# Patient Record
Sex: Male | Born: 1940 | Race: Black or African American | Hispanic: No | Marital: Married | Smoking: Former smoker
Health system: Southern US, Community
[De-identification: ages and names within clinical notes are randomized; demographics above are authoritative.]

## PROBLEM LIST (undated history)

## (undated) DIAGNOSIS — E079 Disorder of thyroid, unspecified: Secondary | ICD-10-CM

## (undated) DIAGNOSIS — K759 Inflammatory liver disease, unspecified: Secondary | ICD-10-CM

## (undated) DIAGNOSIS — Z923 Personal history of irradiation: Secondary | ICD-10-CM

## (undated) DIAGNOSIS — IMO0001 Reserved for inherently not codable concepts without codable children: Secondary | ICD-10-CM

## (undated) DIAGNOSIS — C099 Malignant neoplasm of tonsil, unspecified: Secondary | ICD-10-CM

## (undated) DIAGNOSIS — I10 Essential (primary) hypertension: Secondary | ICD-10-CM

## (undated) DIAGNOSIS — Z9221 Personal history of antineoplastic chemotherapy: Secondary | ICD-10-CM

## (undated) DIAGNOSIS — G8929 Other chronic pain: Secondary | ICD-10-CM

## (undated) DIAGNOSIS — M4800 Spinal stenosis, site unspecified: Secondary | ICD-10-CM

## (undated) DIAGNOSIS — H6690 Otitis media, unspecified, unspecified ear: Secondary | ICD-10-CM

## (undated) DIAGNOSIS — E039 Hypothyroidism, unspecified: Secondary | ICD-10-CM

## (undated) DIAGNOSIS — N400 Enlarged prostate without lower urinary tract symptoms: Secondary | ICD-10-CM

## (undated) DIAGNOSIS — I509 Heart failure, unspecified: Secondary | ICD-10-CM

## (undated) DIAGNOSIS — M549 Dorsalgia, unspecified: Principal | ICD-10-CM

## (undated) DIAGNOSIS — C159 Malignant neoplasm of esophagus, unspecified: Secondary | ICD-10-CM

## (undated) DIAGNOSIS — H538 Other visual disturbances: Secondary | ICD-10-CM

## (undated) DIAGNOSIS — Z5189 Encounter for other specified aftercare: Secondary | ICD-10-CM

## (undated) HISTORY — DX: Benign prostatic hyperplasia without lower urinary tract symptoms: N40.0

## (undated) HISTORY — DX: Other chronic pain: G89.29

## (undated) HISTORY — DX: Dorsalgia, unspecified: M54.9

## (undated) HISTORY — DX: Heart failure, unspecified: I50.9

## (undated) HISTORY — DX: Hypothyroidism, unspecified: E03.9

## (undated) HISTORY — DX: Essential (primary) hypertension: I10

## (undated) HISTORY — DX: Reserved for inherently not codable concepts without codable children: IMO0001

## (undated) HISTORY — PX: TONSILLECTOMY: SUR1361

## (undated) HISTORY — PX: REFRACTIVE SURGERY: SHX103

## (undated) HISTORY — DX: Spinal stenosis, site unspecified: M48.00

## (undated) HISTORY — PX: BACK SURGERY: SHX140

## (undated) HISTORY — DX: Other visual disturbances: H53.8

## (undated) HISTORY — PX: GASTROSTOMY TUBE PLACEMENT: SHX655

## (undated) HISTORY — DX: Otitis media, unspecified, unspecified ear: H66.90

## (undated) HISTORY — DX: Disorder of thyroid, unspecified: E07.9

## (undated) HISTORY — DX: Personal history of antineoplastic chemotherapy: Z92.21

## (undated) HISTORY — PX: CERVICAL FUSION: SHX112

## (undated) HISTORY — DX: Inflammatory liver disease, unspecified: K75.9

## (undated) HISTORY — DX: Malignant neoplasm of tonsil, unspecified: C09.9

## (undated) HISTORY — DX: Encounter for other specified aftercare: Z51.89

## (undated) HISTORY — DX: Personal history of irradiation: Z92.3

---

## 1993-08-22 DIAGNOSIS — K759 Inflammatory liver disease, unspecified: Secondary | ICD-10-CM

## 1993-08-22 HISTORY — DX: Inflammatory liver disease, unspecified: K75.9

## 1997-11-20 ENCOUNTER — Encounter: Admission: RE | Admit: 1997-11-20 | Discharge: 1998-02-18 | Payer: Self-pay | Admitting: Neurosurgery

## 2002-03-06 ENCOUNTER — Encounter: Payer: Self-pay | Admitting: Emergency Medicine

## 2002-03-06 ENCOUNTER — Emergency Department (HOSPITAL_COMMUNITY): Admission: EM | Admit: 2002-03-06 | Discharge: 2002-03-06 | Payer: Self-pay | Admitting: Emergency Medicine

## 2003-01-22 ENCOUNTER — Ambulatory Visit (HOSPITAL_COMMUNITY): Admission: RE | Admit: 2003-01-22 | Discharge: 2003-01-22 | Payer: Self-pay | Admitting: Neurosurgery

## 2003-01-22 ENCOUNTER — Encounter: Payer: Self-pay | Admitting: Neurosurgery

## 2003-02-05 ENCOUNTER — Encounter: Payer: Self-pay | Admitting: Neurosurgery

## 2003-02-05 ENCOUNTER — Inpatient Hospital Stay (HOSPITAL_COMMUNITY): Admission: RE | Admit: 2003-02-05 | Discharge: 2003-02-12 | Payer: Self-pay | Admitting: Neurosurgery

## 2004-04-24 ENCOUNTER — Emergency Department (HOSPITAL_COMMUNITY): Admission: EM | Admit: 2004-04-24 | Discharge: 2004-04-24 | Payer: Self-pay | Admitting: Emergency Medicine

## 2004-12-31 ENCOUNTER — Ambulatory Visit (HOSPITAL_COMMUNITY): Admission: RE | Admit: 2004-12-31 | Discharge: 2004-12-31 | Payer: Self-pay | Admitting: Neurosurgery

## 2005-02-02 ENCOUNTER — Inpatient Hospital Stay (HOSPITAL_COMMUNITY): Admission: RE | Admit: 2005-02-02 | Discharge: 2005-02-05 | Payer: Self-pay | Admitting: Neurosurgery

## 2009-11-12 ENCOUNTER — Ambulatory Visit: Payer: Self-pay | Admitting: Oncology

## 2009-11-16 ENCOUNTER — Ambulatory Visit: Admission: RE | Admit: 2009-11-16 | Discharge: 2010-02-10 | Payer: Self-pay | Admitting: Radiation Oncology

## 2009-11-18 LAB — COMPREHENSIVE METABOLIC PANEL
AST: 13 U/L (ref 0–37)
Alkaline Phosphatase: 66 U/L (ref 39–117)
BUN: 16 mg/dL (ref 6–23)
Creatinine, Ser: 1.43 mg/dL (ref 0.40–1.50)
Potassium: 4.4 mEq/L (ref 3.5–5.3)

## 2009-11-18 LAB — CBC WITH DIFFERENTIAL/PLATELET
Basophils Absolute: 0 10*3/uL (ref 0.0–0.1)
EOS%: 1.2 % (ref 0.0–7.0)
Eosinophils Absolute: 0.1 10*3/uL (ref 0.0–0.5)
HGB: 15.5 g/dL (ref 13.0–17.1)
MCV: 85.1 fL (ref 79.3–98.0)
MONO%: 8.9 % (ref 0.0–14.0)
NEUT#: 3.9 10*3/uL (ref 1.5–6.5)
RBC: 5.29 10*6/uL (ref 4.20–5.82)
RDW: 13.1 % (ref 11.0–14.6)
lymph#: 1.2 10*3/uL (ref 0.9–3.3)

## 2009-11-18 LAB — PROTIME-INR
INR: 1.1 — ABNORMAL LOW (ref 2.00–3.50)
Protime: 13.2 Seconds (ref 10.6–13.4)

## 2009-11-26 ENCOUNTER — Ambulatory Visit: Payer: Self-pay | Admitting: Dentistry

## 2009-11-26 ENCOUNTER — Encounter: Admission: AD | Admit: 2009-11-26 | Discharge: 2009-11-26 | Payer: Self-pay | Admitting: Dentistry

## 2009-11-30 ENCOUNTER — Encounter: Admission: RE | Admit: 2009-11-30 | Discharge: 2010-02-28 | Payer: Self-pay | Admitting: Radiation Oncology

## 2009-12-01 ENCOUNTER — Ambulatory Visit (HOSPITAL_COMMUNITY): Admission: RE | Admit: 2009-12-01 | Discharge: 2009-12-01 | Payer: Self-pay | Admitting: Oncology

## 2009-12-08 ENCOUNTER — Emergency Department (HOSPITAL_COMMUNITY): Admission: EM | Admit: 2009-12-08 | Discharge: 2009-12-08 | Payer: Self-pay | Admitting: Emergency Medicine

## 2009-12-09 LAB — CBC WITH DIFFERENTIAL/PLATELET
BASO%: 0.6 % (ref 0.0–2.0)
Eosinophils Absolute: 0.1 10*3/uL (ref 0.0–0.5)
LYMPH%: 21.6 % (ref 14.0–49.0)
MCHC: 34.1 g/dL (ref 32.0–36.0)
MONO#: 0.5 10*3/uL (ref 0.1–0.9)
NEUT#: 3.2 10*3/uL (ref 1.5–6.5)
RBC: 5.37 10*6/uL (ref 4.20–5.82)
RDW: 13.3 % (ref 11.0–14.6)
WBC: 4.9 10*3/uL (ref 4.0–10.3)
lymph#: 1.1 10*3/uL (ref 0.9–3.3)

## 2009-12-10 LAB — BUN: BUN: 13 mg/dL (ref 6–23)

## 2009-12-10 LAB — CREATININE, SERUM: Creatinine, Ser: 1.39 mg/dL (ref 0.40–1.50)

## 2009-12-25 ENCOUNTER — Ambulatory Visit: Payer: Self-pay | Admitting: Oncology

## 2009-12-28 LAB — CBC WITH DIFFERENTIAL/PLATELET
EOS%: 1.2 % (ref 0.0–7.0)
Eosinophils Absolute: 0.1 10*3/uL (ref 0.0–0.5)
HCT: 46.1 % (ref 38.4–49.9)
HGB: 15.8 g/dL (ref 13.0–17.1)
LYMPH%: 23.7 % (ref 14.0–49.0)
MCH: 28.7 pg (ref 27.2–33.4)
MONO%: 10.8 % (ref 0.0–14.0)
NEUT#: 3.1 10*3/uL (ref 1.5–6.5)
WBC: 4.9 10*3/uL (ref 4.0–10.3)
lymph#: 1.2 10*3/uL (ref 0.9–3.3)

## 2009-12-28 LAB — COMPREHENSIVE METABOLIC PANEL
AST: 17 U/L (ref 0–37)
Alkaline Phosphatase: 64 U/L (ref 39–117)
BUN: 13 mg/dL (ref 6–23)
CO2: 26 mEq/L (ref 19–32)
Calcium: 9 mg/dL (ref 8.4–10.5)
Sodium: 140 mEq/L (ref 135–145)
Total Bilirubin: 1.3 mg/dL — ABNORMAL HIGH (ref 0.3–1.2)

## 2009-12-28 LAB — MAGNESIUM: Magnesium: 2.3 mg/dL (ref 1.5–2.5)

## 2010-01-04 LAB — CBC WITH DIFFERENTIAL/PLATELET
BASO%: 0.6 % (ref 0.0–2.0)
Basophils Absolute: 0 10*3/uL (ref 0.0–0.1)
EOS%: 0.7 % (ref 0.0–7.0)
Eosinophils Absolute: 0 10*3/uL (ref 0.0–0.5)
HCT: 41.9 % (ref 38.4–49.9)
HGB: 14.9 g/dL (ref 13.0–17.1)
MCHC: 35.6 g/dL (ref 32.0–36.0)
MCV: 82.6 fL (ref 79.3–98.0)
RDW: 12.8 % (ref 11.0–14.6)
lymph#: 0.6 10*3/uL — ABNORMAL LOW (ref 0.9–3.3)

## 2010-01-04 LAB — COMPREHENSIVE METABOLIC PANEL
ALT: 19 U/L (ref 0–53)
Albumin: 4 g/dL (ref 3.5–5.2)
Alkaline Phosphatase: 66 U/L (ref 39–117)
BUN: 29 mg/dL — ABNORMAL HIGH (ref 6–23)
Calcium: 8.4 mg/dL (ref 8.4–10.5)
Chloride: 99 mEq/L (ref 96–112)
Glucose, Bld: 105 mg/dL — ABNORMAL HIGH (ref 70–99)
Potassium: 3.9 mEq/L (ref 3.5–5.3)
Total Bilirubin: 1.5 mg/dL — ABNORMAL HIGH (ref 0.3–1.2)

## 2010-01-04 LAB — MAGNESIUM: Magnesium: 1.9 mg/dL (ref 1.5–2.5)

## 2010-01-11 LAB — CBC WITH DIFFERENTIAL/PLATELET
BASO%: 0.2 % (ref 0.0–2.0)
EOS%: 1.2 % (ref 0.0–7.0)
HGB: 15.1 g/dL (ref 13.0–17.1)
LYMPH%: 10.6 % — ABNORMAL LOW (ref 14.0–49.0)
MCH: 29 pg (ref 27.2–33.4)
MCHC: 35.6 g/dL (ref 32.0–36.0)
MONO#: 0.4 10*3/uL (ref 0.1–0.9)
MONO%: 8.2 % (ref 0.0–14.0)
NEUT#: 3.4 10*3/uL (ref 1.5–6.5)
Platelets: 114 10*3/uL — ABNORMAL LOW (ref 140–400)
RBC: 5.2 10*6/uL (ref 4.20–5.82)

## 2010-01-11 LAB — COMPREHENSIVE METABOLIC PANEL
ALT: 15 U/L (ref 0–53)
BUN: 20 mg/dL (ref 6–23)
CO2: 30 mEq/L (ref 19–32)
Chloride: 101 mEq/L (ref 96–112)
Creatinine, Ser: 2.29 mg/dL — ABNORMAL HIGH (ref 0.40–1.50)
Potassium: 4.3 mEq/L (ref 3.5–5.3)
Total Bilirubin: 1.7 mg/dL — ABNORMAL HIGH (ref 0.3–1.2)

## 2010-01-15 LAB — COMPREHENSIVE METABOLIC PANEL
Albumin: 4.3 g/dL (ref 3.5–5.2)
Alkaline Phosphatase: 61 U/L (ref 39–117)
BUN: 27 mg/dL — ABNORMAL HIGH (ref 6–23)
CO2: 27 mEq/L (ref 19–32)
Glucose, Bld: 93 mg/dL (ref 70–99)
Total Bilirubin: 1.1 mg/dL (ref 0.3–1.2)

## 2010-01-15 LAB — CBC WITH DIFFERENTIAL/PLATELET
Basophils Absolute: 0 10*3/uL (ref 0.0–0.1)
EOS%: 2.6 % (ref 0.0–7.0)
HGB: 14.1 g/dL (ref 13.0–17.1)
MCHC: 35.3 g/dL (ref 32.0–36.0)
MCV: 83.6 fL (ref 79.3–98.0)
MONO#: 0.3 10*3/uL (ref 0.1–0.9)
MONO%: 24.5 % — ABNORMAL HIGH (ref 0.0–14.0)
NEUT%: 52.2 % (ref 39.0–75.0)
RBC: 4.77 10*6/uL (ref 4.20–5.82)
WBC: 1.3 10*3/uL — ABNORMAL LOW (ref 4.0–10.3)
lymph#: 0.2 10*3/uL — ABNORMAL LOW (ref 0.9–3.3)

## 2010-01-19 LAB — COMPREHENSIVE METABOLIC PANEL
AST: 17 U/L (ref 0–37)
Albumin: 4.1 g/dL (ref 3.5–5.2)
BUN: 35 mg/dL — ABNORMAL HIGH (ref 6–23)
Calcium: 9.4 mg/dL (ref 8.4–10.5)
Chloride: 96 mEq/L (ref 96–112)
Glucose, Bld: 102 mg/dL — ABNORMAL HIGH (ref 70–99)
Potassium: 3.8 mEq/L (ref 3.5–5.3)
Sodium: 138 mEq/L (ref 135–145)
Total Protein: 8 g/dL (ref 6.0–8.3)

## 2010-01-19 LAB — CBC WITH DIFFERENTIAL/PLATELET
BASO%: 1.8 % (ref 0.0–2.0)
EOS%: 1.8 % (ref 0.0–7.0)
Eosinophils Absolute: 0 10*3/uL (ref 0.0–0.5)
LYMPH%: 25.3 % (ref 14.0–49.0)
MCHC: 34.7 g/dL (ref 32.0–36.0)
MCV: 82.9 fL (ref 79.3–98.0)
MONO%: 36.7 % — ABNORMAL HIGH (ref 0.0–14.0)
NEUT#: 0.6 10*3/uL — ABNORMAL LOW (ref 1.5–6.5)
Platelets: 226 10*3/uL (ref 140–400)
RBC: 5.08 10*6/uL (ref 4.20–5.82)
RDW: 13.2 % (ref 11.0–14.6)

## 2010-01-20 ENCOUNTER — Ambulatory Visit (HOSPITAL_COMMUNITY): Admission: RE | Admit: 2010-01-20 | Discharge: 2010-01-20 | Payer: Self-pay | Admitting: Radiation Oncology

## 2010-01-25 ENCOUNTER — Ambulatory Visit: Payer: Self-pay | Admitting: Oncology

## 2010-01-25 ENCOUNTER — Other Ambulatory Visit: Payer: Self-pay | Admitting: Oncology

## 2010-01-25 LAB — COMPREHENSIVE METABOLIC PANEL
ALT: 18 U/L (ref 0–53)
AST: 17 U/L (ref 0–37)
Albumin: 3.9 g/dL (ref 3.5–5.2)
Alkaline Phosphatase: 60 U/L (ref 39–117)
BUN: 28 mg/dL — ABNORMAL HIGH (ref 6–23)
Calcium: 9.5 mg/dL (ref 8.4–10.5)
Chloride: 96 mEq/L (ref 96–112)
Sodium: 136 mEq/L (ref 135–145)
Total Protein: 7.4 g/dL (ref 6.0–8.3)

## 2010-01-25 LAB — CBC WITH DIFFERENTIAL/PLATELET
HCT: 40.2 % (ref 38.4–49.9)
HGB: 14.2 g/dL (ref 13.0–17.1)
MCH: 28.7 pg (ref 27.2–33.4)
MCV: 81.2 fL (ref 79.3–98.0)
MONO%: 17.7 % — ABNORMAL HIGH (ref 0.0–14.0)
NEUT#: 2.2 10*3/uL (ref 1.5–6.5)
RBC: 4.95 10*6/uL (ref 4.20–5.82)
WBC: 3.1 10*3/uL — ABNORMAL LOW (ref 4.0–10.3)
lymph#: 0.3 10*3/uL — ABNORMAL LOW (ref 0.9–3.3)

## 2010-01-26 ENCOUNTER — Ambulatory Visit: Payer: Self-pay | Admitting: Dentistry

## 2010-02-01 LAB — COMPREHENSIVE METABOLIC PANEL
Calcium: 9.3 mg/dL (ref 8.4–10.5)
Chloride: 98 mEq/L (ref 96–112)
Creatinine, Ser: 2.36 mg/dL — ABNORMAL HIGH (ref 0.40–1.50)
Potassium: 4.8 mEq/L (ref 3.5–5.3)
Sodium: 134 mEq/L — ABNORMAL LOW (ref 135–145)
Total Bilirubin: 1.7 mg/dL — ABNORMAL HIGH (ref 0.3–1.2)
Total Protein: 7.3 g/dL (ref 6.0–8.3)

## 2010-02-01 LAB — CBC WITH DIFFERENTIAL/PLATELET
Basophils Absolute: 0 10*3/uL (ref 0.0–0.1)
EOS%: 0.4 % (ref 0.0–7.0)
HCT: 39.1 % (ref 38.4–49.9)
HGB: 13.8 g/dL (ref 13.0–17.1)
MCH: 28.8 pg (ref 27.2–33.4)
MCV: 81.6 fL (ref 79.3–98.0)
MONO%: 8.5 % (ref 0.0–14.0)
NEUT%: 76.4 % — ABNORMAL HIGH (ref 39.0–75.0)
Platelets: 129 10*3/uL — ABNORMAL LOW (ref 140–400)
RBC: 4.79 10*6/uL (ref 4.20–5.82)
RDW: 13.4 % (ref 11.0–14.6)

## 2010-02-01 LAB — MAGNESIUM: Magnesium: 2.2 mg/dL (ref 1.5–2.5)

## 2010-02-08 LAB — COMPREHENSIVE METABOLIC PANEL
ALT: 9 U/L (ref 0–53)
AST: 12 U/L (ref 0–37)
Albumin: 3.8 g/dL (ref 3.5–5.2)
Alkaline Phosphatase: 47 U/L (ref 39–117)
BUN: 18 mg/dL (ref 6–23)
Calcium: 8.6 mg/dL (ref 8.4–10.5)
Creatinine, Ser: 1.3 mg/dL (ref 0.40–1.50)
Glucose, Bld: 103 mg/dL — ABNORMAL HIGH (ref 70–99)
Total Bilirubin: 1 mg/dL (ref 0.3–1.2)
Total Protein: 6.5 g/dL (ref 6.0–8.3)

## 2010-02-08 LAB — CBC WITH DIFFERENTIAL/PLATELET
EOS%: 1.2 % (ref 0.0–7.0)
Eosinophils Absolute: 0 10*3/uL (ref 0.0–0.5)
MCHC: 33.7 g/dL (ref 32.0–36.0)
MCV: 84.5 fL (ref 79.3–98.0)
MONO%: 8.2 % (ref 0.0–14.0)
NEUT#: 2 10*3/uL (ref 1.5–6.5)
RBC: 4.3 10*6/uL (ref 4.20–5.82)
RDW: 13.4 % (ref 11.0–14.6)
lymph#: 0.1 10*3/uL — ABNORMAL LOW (ref 0.9–3.3)

## 2010-02-15 ENCOUNTER — Ambulatory Visit: Admission: RE | Admit: 2010-02-15 | Discharge: 2010-02-15 | Payer: Self-pay | Admitting: Radiation Oncology

## 2010-02-25 ENCOUNTER — Ambulatory Visit: Payer: Self-pay | Admitting: Oncology

## 2010-02-25 LAB — COMPREHENSIVE METABOLIC PANEL
ALT: 18 U/L (ref 0–53)
AST: 21 U/L (ref 0–37)
Alkaline Phosphatase: 54 U/L (ref 39–117)
Calcium: 9.3 mg/dL (ref 8.4–10.5)
Creatinine, Ser: 1.35 mg/dL (ref 0.40–1.50)
Potassium: 3.9 mEq/L (ref 3.5–5.3)
Total Bilirubin: 0.6 mg/dL (ref 0.3–1.2)
Total Protein: 7 g/dL (ref 6.0–8.3)

## 2010-02-25 LAB — MAGNESIUM: Magnesium: 2.1 mg/dL (ref 1.5–2.5)

## 2010-02-25 LAB — CBC WITH DIFFERENTIAL/PLATELET
Eosinophils Absolute: 0 10*3/uL (ref 0.0–0.5)
LYMPH%: 7.6 % — ABNORMAL LOW (ref 14.0–49.0)
MONO#: 0.5 10*3/uL (ref 0.1–0.9)
NEUT#: 2.6 10*3/uL (ref 1.5–6.5)
NEUT%: 77.8 % — ABNORMAL HIGH (ref 39.0–75.0)
Platelets: 421 10*3/uL — ABNORMAL HIGH (ref 140–400)
RBC: 3.85 10*6/uL — ABNORMAL LOW (ref 4.20–5.82)
RDW: 14.9 % — ABNORMAL HIGH (ref 11.0–14.6)
WBC: 3.4 10*3/uL — ABNORMAL LOW (ref 4.0–10.3)
lymph#: 0.3 10*3/uL — ABNORMAL LOW (ref 0.9–3.3)

## 2010-03-15 LAB — COMPREHENSIVE METABOLIC PANEL
ALT: 16 U/L (ref 0–53)
Albumin: 3.8 g/dL (ref 3.5–5.2)
CO2: 31 mEq/L (ref 19–32)
Calcium: 8.9 mg/dL (ref 8.4–10.5)
Sodium: 141 mEq/L (ref 135–145)
Total Bilirubin: 0.6 mg/dL (ref 0.3–1.2)

## 2010-03-15 LAB — CBC WITH DIFFERENTIAL/PLATELET
BASO%: 0.7 % (ref 0.0–2.0)
Basophils Absolute: 0 10*3/uL (ref 0.0–0.1)
Eosinophils Absolute: 0.1 10*3/uL (ref 0.0–0.5)
HGB: 11.7 g/dL — ABNORMAL LOW (ref 13.0–17.1)
LYMPH%: 14 % (ref 14.0–49.0)
MCHC: 34.8 g/dL (ref 32.0–36.0)
MONO#: 0.4 10*3/uL (ref 0.1–0.9)
NEUT%: 71.1 % (ref 39.0–75.0)
RBC: 3.9 10*6/uL — ABNORMAL LOW (ref 4.20–5.82)
lymph#: 0.5 10*3/uL — ABNORMAL LOW (ref 0.9–3.3)

## 2010-04-08 ENCOUNTER — Ambulatory Visit: Payer: Self-pay | Admitting: Oncology

## 2010-04-27 ENCOUNTER — Ambulatory Visit: Admission: RE | Admit: 2010-04-27 | Discharge: 2010-04-27 | Payer: Self-pay | Admitting: Radiation Oncology

## 2010-05-10 ENCOUNTER — Ambulatory Visit: Payer: Self-pay | Admitting: Oncology

## 2010-05-11 ENCOUNTER — Ambulatory Visit (HOSPITAL_COMMUNITY): Admission: RE | Admit: 2010-05-11 | Discharge: 2010-05-11 | Payer: Self-pay | Admitting: Oncology

## 2010-05-12 LAB — CBC WITH DIFFERENTIAL/PLATELET
BASO%: 0.7 % (ref 0.0–2.0)
Eosinophils Absolute: 0.1 10*3/uL (ref 0.0–0.5)
MCV: 86.9 fL (ref 79.3–98.0)
Platelets: 223 10*3/uL (ref 140–400)
WBC: 2.9 10*3/uL — ABNORMAL LOW (ref 4.0–10.3)
lymph#: 0.4 10*3/uL — ABNORMAL LOW (ref 0.9–3.3)

## 2010-05-13 LAB — COMPREHENSIVE METABOLIC PANEL
AST: 11 U/L (ref 0–37)
Albumin: 4 g/dL (ref 3.5–5.2)
Alkaline Phosphatase: 56 U/L (ref 39–117)
BUN: 24 mg/dL — ABNORMAL HIGH (ref 6–23)
CO2: 26 mEq/L (ref 19–32)
Calcium: 9.1 mg/dL (ref 8.4–10.5)
Creatinine, Ser: 1.42 mg/dL (ref 0.40–1.50)
Glucose, Bld: 96 mg/dL (ref 70–99)
Potassium: 4.3 mEq/L (ref 3.5–5.3)
Total Bilirubin: 0.5 mg/dL (ref 0.3–1.2)

## 2010-05-13 LAB — MAGNESIUM: Magnesium: 2.1 mg/dL (ref 1.5–2.5)

## 2010-07-29 ENCOUNTER — Inpatient Hospital Stay (HOSPITAL_COMMUNITY): Admission: EM | Admit: 2010-07-29 | Discharge: 2010-02-18 | Payer: Self-pay | Admitting: Emergency Medicine

## 2010-09-12 ENCOUNTER — Encounter: Payer: Self-pay | Admitting: Oncology

## 2010-09-30 ENCOUNTER — Other Ambulatory Visit: Payer: Self-pay | Admitting: Oncology

## 2010-09-30 ENCOUNTER — Encounter (HOSPITAL_BASED_OUTPATIENT_CLINIC_OR_DEPARTMENT_OTHER): Payer: MEDICARE | Admitting: Oncology

## 2010-09-30 DIAGNOSIS — N289 Disorder of kidney and ureter, unspecified: Secondary | ICD-10-CM

## 2010-09-30 DIAGNOSIS — C099 Malignant neoplasm of tonsil, unspecified: Secondary | ICD-10-CM

## 2010-09-30 LAB — COMPREHENSIVE METABOLIC PANEL
AST: 15 U/L (ref 0–37)
Albumin: 3.9 g/dL (ref 3.5–5.2)
Alkaline Phosphatase: 52 U/L (ref 39–117)
CO2: 30 mEq/L (ref 19–32)
Calcium: 8.7 mg/dL (ref 8.4–10.5)
Chloride: 101 mEq/L (ref 96–112)
Creatinine, Ser: 1.4 mg/dL (ref 0.40–1.50)
Glucose, Bld: 95 mg/dL (ref 70–99)
Total Bilirubin: 0.6 mg/dL (ref 0.3–1.2)

## 2010-09-30 LAB — CBC WITH DIFFERENTIAL/PLATELET
Basophils Absolute: 0 10*3/uL (ref 0.0–0.1)
HGB: 12.6 g/dL — ABNORMAL LOW (ref 13.0–17.1)
MCV: 85.9 fL (ref 79.3–98.0)
MONO#: 0.5 10*3/uL (ref 0.1–0.9)
MONO%: 14.9 % — ABNORMAL HIGH (ref 0.0–14.0)
NEUT%: 70.4 % (ref 39.0–75.0)
RDW: 14 % (ref 11.0–14.6)
lymph#: 0.4 10*3/uL — ABNORMAL LOW (ref 0.9–3.3)

## 2010-11-03 ENCOUNTER — Other Ambulatory Visit: Payer: Self-pay | Admitting: Radiation Oncology

## 2010-11-03 ENCOUNTER — Ambulatory Visit: Payer: MEDICARE | Attending: Radiation Oncology | Admitting: Radiation Oncology

## 2010-11-03 DIAGNOSIS — C099 Malignant neoplasm of tonsil, unspecified: Secondary | ICD-10-CM | POA: Insufficient documentation

## 2010-11-03 DIAGNOSIS — I10 Essential (primary) hypertension: Secondary | ICD-10-CM | POA: Insufficient documentation

## 2010-11-03 DIAGNOSIS — R7309 Other abnormal glucose: Secondary | ICD-10-CM | POA: Insufficient documentation

## 2010-11-03 LAB — CBC WITH DIFFERENTIAL/PLATELET
Basophils Absolute: 0 10*3/uL (ref 0.0–0.1)
Eosinophils Absolute: 0.1 10*3/uL (ref 0.0–0.5)
HCT: 38.5 % (ref 38.4–49.9)
HGB: 13 g/dL (ref 13.0–17.1)
LYMPH%: 6.7 % — ABNORMAL LOW (ref 14.0–49.0)
MCV: 85 fL (ref 79.3–98.0)
MONO%: 9.2 % (ref 0.0–14.0)
NEUT#: 4 10*3/uL (ref 1.5–6.5)
Platelets: 186 10*3/uL (ref 140–400)

## 2010-11-03 LAB — TSH: TSH: 4.855 u[IU]/mL — ABNORMAL HIGH (ref 0.350–4.500)

## 2010-11-03 LAB — BASIC METABOLIC PANEL
BUN: 23 mg/dL (ref 6–23)
Calcium: 9.4 mg/dL (ref 8.4–10.5)
Glucose, Bld: 103 mg/dL — ABNORMAL HIGH (ref 70–99)

## 2010-11-07 LAB — CULTURE, BLOOD (ROUTINE X 2)

## 2010-11-07 LAB — URINALYSIS, ROUTINE W REFLEX MICROSCOPIC
Bilirubin Urine: NEGATIVE
Glucose, UA: NEGATIVE mg/dL
pH: 6 (ref 5.0–8.0)

## 2010-11-07 LAB — URINE MICROSCOPIC-ADD ON

## 2010-11-07 LAB — CBC
HCT: 31.4 % — ABNORMAL LOW (ref 39.0–52.0)
Hemoglobin: 11.2 g/dL — ABNORMAL LOW (ref 13.0–17.0)
MCV: 84.4 fL (ref 78.0–100.0)
RBC: 3.72 MIL/uL — ABNORMAL LOW (ref 4.22–5.81)
WBC: 3.4 10*3/uL — ABNORMAL LOW (ref 4.0–10.5)

## 2010-11-07 LAB — COMPREHENSIVE METABOLIC PANEL
Albumin: 3.5 g/dL (ref 3.5–5.2)
Alkaline Phosphatase: 51 U/L (ref 39–117)
BUN: 21 mg/dL (ref 6–23)
Chloride: 99 mEq/L (ref 96–112)
Glucose, Bld: 159 mg/dL — ABNORMAL HIGH (ref 70–99)
Potassium: 4.1 mEq/L (ref 3.5–5.1)
Total Bilirubin: 0.8 mg/dL (ref 0.3–1.2)

## 2010-11-07 LAB — OVA AND PARASITE EXAMINATION: Ova and parasites: NONE SEEN

## 2010-11-07 LAB — D-DIMER, QUANTITATIVE: D-Dimer, Quant: 1.27 ug/mL-FEU — ABNORMAL HIGH (ref 0.00–0.48)

## 2010-11-07 LAB — CLOSTRIDIUM DIFFICILE EIA: C difficile Toxins A+B, EIA: NEGATIVE

## 2010-11-07 LAB — DIFFERENTIAL
Basophils Absolute: 0 10*3/uL (ref 0.0–0.1)
Basophils Relative: 0 % (ref 0–1)
Monocytes Absolute: 0.4 10*3/uL (ref 0.1–1.0)
Neutro Abs: 2.9 10*3/uL (ref 1.7–7.7)
Neutrophils Relative %: 84 % — ABNORMAL HIGH (ref 43–77)

## 2010-11-07 LAB — GLUCOSE, CAPILLARY
Glucose-Capillary: 137 mg/dL — ABNORMAL HIGH (ref 70–99)
Glucose-Capillary: 147 mg/dL — ABNORMAL HIGH (ref 70–99)

## 2010-11-07 LAB — URINE CULTURE

## 2010-11-07 LAB — STOOL CULTURE

## 2010-11-08 LAB — CBC
Platelets: 332 10*3/uL (ref 150–400)
RDW: 13.8 % (ref 11.5–15.5)

## 2010-11-08 LAB — PROTIME-INR: INR: 1.11 (ref 0.00–1.49)

## 2010-11-08 LAB — APTT: aPTT: 34 seconds (ref 24–37)

## 2010-11-08 LAB — BASIC METABOLIC PANEL
BUN: 32 mg/dL — ABNORMAL HIGH (ref 6–23)
Calcium: 9.1 mg/dL (ref 8.4–10.5)
Creatinine, Ser: 2.38 mg/dL — ABNORMAL HIGH (ref 0.4–1.5)
GFR calc non Af Amer: 27 mL/min — ABNORMAL LOW (ref >60)
Glucose, Bld: 107 mg/dL — ABNORMAL HIGH (ref 70–99)

## 2010-11-09 ENCOUNTER — Other Ambulatory Visit (HOSPITAL_COMMUNITY): Payer: MEDICARE

## 2010-11-09 ENCOUNTER — Ambulatory Visit (HOSPITAL_COMMUNITY)
Admission: RE | Admit: 2010-11-09 | Discharge: 2010-11-09 | Disposition: A | Discharge status: Home / Self Care | Payer: MEDICARE | Source: Ambulatory Visit | Attending: Radiation Oncology | Admitting: Radiation Oncology

## 2010-11-09 DIAGNOSIS — Z431 Encounter for attention to gastrostomy: Secondary | ICD-10-CM | POA: Insufficient documentation

## 2010-11-09 DIAGNOSIS — C099 Malignant neoplasm of tonsil, unspecified: Secondary | ICD-10-CM | POA: Insufficient documentation

## 2010-11-09 LAB — CBC
MCV: 85.9 fL (ref 78.0–100.0)
Platelets: 177 10*3/uL (ref 150–400)
RDW: 13.6 % (ref 11.5–15.5)
WBC: 4.9 10*3/uL (ref 4.0–10.5)

## 2010-11-09 LAB — DIFFERENTIAL
Basophils Absolute: 0 10*3/uL (ref 0.0–0.1)
Eosinophils Absolute: 0.1 10*3/uL (ref 0.0–0.7)
Lymphocytes Relative: 23 % (ref 12–46)
Lymphs Abs: 1.1 10*3/uL (ref 0.7–4.0)
Neutrophils Relative %: 65 % (ref 43–77)

## 2010-11-09 LAB — BASIC METABOLIC PANEL
BUN: 19 mg/dL (ref 6–23)
Creatinine, Ser: 1.51 mg/dL — ABNORMAL HIGH (ref 0.4–1.5)
GFR calc non Af Amer: 46 mL/min — ABNORMAL LOW (ref >60)
Glucose, Bld: 117 mg/dL — ABNORMAL HIGH (ref 70–99)

## 2010-11-09 LAB — POCT CARDIAC MARKERS: Myoglobin, poc: 58.4 ng/mL (ref 12–200)

## 2011-01-07 NOTE — H&P (Signed)
NAME:  ALOYSIUS, Adam Chambers NO.:  [id number]   MEDICAL RECORD NO.:  [id number]          PATIENT TYPE:  INP   LOCATION:  2899                         FACILITY:  [facility]   PHYSICIAN:  Hilda Lias, M.D.   DATE OF BIRTH:  27-Jun-1941   DATE OF ADMISSION:  02/02/2005  DATE OF DISCHARGE:                                HISTORY & PHYSICAL   Adam Chambers is a gentleman who in the past underwent back surgery.  Now he has  been complaining of neck pain with radiation going to the left upper  extremity associated with weakness and decrease of cervical movement because  of the pain.  The patient had a neurological workup which showed compromise  with a stenosis at 3-4, 4-5, and 5-6, and _________ to C6 and C7.  Because  of pain, the patient wants to proceed with surgery.   PAST MEDICAL HISTORY:  Lumbar fusion.   FAMILY HISTORY:  Unremarkable.   REVIEW OF SYSTEMS:  Positive for neck and left upper extremity pain.   PHYSICAL EXAMINATION:  HEENT:  Normal.  NECK:  He is able to flex and extend __________.  CARDIOVASCULAR:  Normal.  LUNGS:  Clear.  ABDOMEN:  Normal.  EXTREMITIES:  Normal pulses.  NEUROLOGY:  Mental status normal.  Cranial nerves normal.  Strength showed  that he has weakness in the deltoid and biceps.  Reflexes are symmetrical at  1+ and no Babinski's.  Sensation is normal.   The cervical spine x-ray showed that he has cervical stenosis with cervical  spondylosis at the level of 3-4, 4-5, and 5-6, and positive __________ at C6  and C7.   PLAN:  Decompression at the level of 3-4, 4-5, 5-6, and possible at C6-7.   RECOMMENDATIONS:  The patient is being admitted for decompression of those  three or four levels.  Decision about C6-7 will be made during surgery.  The  risks were fully explained to him including the possibility of no  improvement, damage to the vocal cord, damage to the esophagus, and stroke.       EB/MEDQ  D:  02/02/2005  T:  02/02/2005  Job:   [id number]

## 2011-01-07 NOTE — Op Note (Signed)
NAME:  Adam Chambers, Adam Chambers                   ACCOUNT NO.:  [id number]   MEDICAL RECORD NO.:  [id number]          PATIENT TYPE:  INP   LOCATION:  2899                         FACILITY:  [facility]   PHYSICIAN:  Hilda Lias, M.D.   DATE OF BIRTH:  04/16/1941   DATE OF PROCEDURE:  02/02/2005  DATE OF DISCHARGE:                                 OPERATIVE REPORT   PREOPERATIVE DIAGNOSIS:  C3-C4, C4-C5, C5-C6, and C6-C7 stenosis with  calcifications of the posterior ligament with acute left radiculopathy.   POSTOPERATIVE DIAGNOSIS:  C3-C4, C4-C5, C5-C6, and C6-C7 stenosis with  calcifications of the posterior ligament with acute left radiculopathy.   PROCEDURE:  Anterior decompression of C3-C4, C4-C5, C5-C6, and C6-C7,  interbody fusion with allograft, plate, microscope.   SURGEON:  Hilda Lias, M.D.   ASSISTANT:  Coletta Memos, M.D.   CLINICAL HISTORY:  The patient was admitted because of neck and left upper  extremity pain.  The patient has failed conservative treatment.  X-ray  showed stenosis at the level of C3-C4, C4-C5, C5-C6, and C6-C7 with  calcification of the posterior ligament.  The surgery was explained to him  and his wife including the possibility of posterior decompression later on.   PROCEDURE:  The patient was taken to the OR and after intubation, the neck  was prepped with Betadine.  A longitudinal incision was made through the  skin, subcutaneous tissue, platysma, down to the cervical spine.  An x-ray  showed that we were at the level of C3-C4.  From then on, we opened the  anterior ligament of 3-4, 4-5, 5-6, and 6-7.  We brought the microscope into  the area.  What we found at all levels was the patient has quite a bit of  degenerative disc disease up to the point that there was no normal disc  inbetween.  Removal of degenerative disc was accomplished.  We went straight  to the posterior ligament and we found that the posterior ligament was  calcified up to the point  especially at the level of 4-5, 5-6, and 6-7,  there was no plane between the calcification of the ligament and the dura  matter.  At the level of 3-4, it was a little bit easier and decompression  of the C4 nerve root was accomplished with decompression of the spinal cord.  At those three levels below, we started to work our plane between the dura  matter and the posterior ligament.  Decompression was achieved.  At the  level of C6-C7, the calcification was so intense that there was a place  where the dural matter was removed followed with the calcified ligament.  The procedure was done at C5-C6.  At the end, we had a good decompression of  the foramen bilaterally at all four levels as well as the spinal cord.  We  used Tisseel at the area of C6-C7 to prevent any CSF leak.  After that, the  area was irrigated.  We introduced four grafts of 6 mm in height with  lordosis with autograft BMX inside.  Then, this was  followed by a plate from  C3 down to C7.  We took our x-ray and showed that, indeed, the area between  C3-C4 was normal but because of the shoulders, we were unable to see  anything below.  Nevertheless, at  the end, the plate was secured in place using nine screws.  The area was  irrigated.  We waited at least ten minutes to be sure there was no evidence  of bleeding.  Once we were sure there was no bleeding, a drain was left in  the prevertebral area and the wound was closed with Vicryl and Steri-Strips.       EB/MEDQ  D:  02/02/2005  T:  02/02/2005  Job:  [id number]

## 2011-01-07 NOTE — Discharge Summary (Signed)
NAME:  Adam Chambers, Adam Chambers NO.:  [id number]   MEDICAL RECORD NO.:  [id number]          PATIENT TYPE:  INP   LOCATION:  3038                         FACILITY:  [facility]   PHYSICIAN:  Hilda Lias, M.D.   DATE OF BIRTH:  1941-06-27   DATE OF ADMISSION:  02/02/2005  DATE OF DISCHARGE:  02/05/2005                                 DISCHARGE SUMMARY   ADMISSION DIAGNOSIS:  Cervical stenosis with radiculopathy, calcification in  the posterior ligament.   POSTOPERATIVE DIAGNOSIS:  Cervical stenosis with radiculopathy,  calcification in the posterior ligament.   CLINICAL HISTORY:  The patient was admitted because of neck pain with  radiation to the left upper extremity.  X-rays show a stenosis with  calcification of the posterior ligament.  Surgery was advised.  Laboratory  normal.   COURSE IN THE HOSPITAL:  The patient was taken to surgery and decompressed  at the level of 3-4, 4-5, 5-6 and 6-7 was done followed by a bone graft  plate.  The patient was kept over night in the intensive care unit.  Today  he is stable, he is ambulating and the pain is gone.  The wound looks fine,  he is ready to go home.   CONDITION ON DISCHARGE:  Improving.   MEDICATIONS:  Percocet, diazepam, Neurontin.   DIET:  Regular.   ACTIVITY:  Not to drive for at least a week.   FOLLOWUP:  She is going to see me in my office in 3-4 weeks or as needed  sooner.       EB/MEDQ  D:  02/05/2005  T:  02/06/2005  Job:  [id number]

## 2011-01-27 ENCOUNTER — Other Ambulatory Visit: Payer: Self-pay | Admitting: Oncology

## 2011-01-27 ENCOUNTER — Encounter (HOSPITAL_BASED_OUTPATIENT_CLINIC_OR_DEPARTMENT_OTHER): Payer: Medicare Other | Admitting: Oncology

## 2011-01-27 DIAGNOSIS — N289 Disorder of kidney and ureter, unspecified: Secondary | ICD-10-CM

## 2011-01-27 DIAGNOSIS — C099 Malignant neoplasm of tonsil, unspecified: Secondary | ICD-10-CM

## 2011-01-27 LAB — COMPREHENSIVE METABOLIC PANEL
AST: 13 U/L (ref 0–37)
Alkaline Phosphatase: 55 U/L (ref 39–117)
BUN: 21 mg/dL (ref 6–23)
Calcium: 9 mg/dL (ref 8.4–10.5)
Creatinine, Ser: 1.42 mg/dL — ABNORMAL HIGH (ref 0.50–1.35)

## 2011-01-27 LAB — CBC WITH DIFFERENTIAL/PLATELET
Basophils Absolute: 0 10*3/uL (ref 0.0–0.1)
EOS%: 4.4 % (ref 0.0–7.0)
HCT: 36.5 % — ABNORMAL LOW (ref 38.4–49.9)
HGB: 12.4 g/dL — ABNORMAL LOW (ref 13.0–17.1)
MCH: 29.1 pg (ref 27.2–33.4)
MCV: 85.5 fL (ref 79.3–98.0)
MONO%: 11.1 % (ref 0.0–14.0)
NEUT%: 74.6 % (ref 39.0–75.0)

## 2011-03-14 ENCOUNTER — Ambulatory Visit
Admission: RE | Admit: 2011-03-14 | Discharge: 2011-03-14 | Disposition: A | Discharge status: Home / Self Care | Payer: Medicare Other | Source: Ambulatory Visit | Attending: Radiation Oncology | Admitting: Radiation Oncology

## 2011-06-06 ENCOUNTER — Ambulatory Visit: Payer: Medicare Other | Admitting: Radiation Oncology

## 2011-07-01 ENCOUNTER — Encounter: Payer: Self-pay | Admitting: *Deleted

## 2011-07-01 DIAGNOSIS — M549 Dorsalgia, unspecified: Secondary | ICD-10-CM | POA: Insufficient documentation

## 2011-07-01 DIAGNOSIS — C099 Malignant neoplasm of tonsil, unspecified: Secondary | ICD-10-CM | POA: Insufficient documentation

## 2011-07-01 DIAGNOSIS — G8929 Other chronic pain: Secondary | ICD-10-CM

## 2011-07-04 ENCOUNTER — Encounter: Payer: Self-pay | Admitting: *Deleted

## 2011-07-04 ENCOUNTER — Encounter: Payer: Self-pay | Admitting: Radiation Oncology

## 2011-07-04 ENCOUNTER — Ambulatory Visit
Admission: RE | Admit: 2011-07-04 | Discharge: 2011-07-04 | Disposition: A | Discharge status: Home / Self Care | Payer: Medicare Other | Source: Ambulatory Visit | Attending: Radiation Oncology | Admitting: Radiation Oncology

## 2011-07-04 VITALS — BP 154/91 | HR 49 | Temp 97.7°F | Resp 20 | Ht 76.0 in | Wt 180.1 lb

## 2011-07-04 DIAGNOSIS — C099 Malignant neoplasm of tonsil, unspecified: Secondary | ICD-10-CM

## 2011-07-04 NOTE — Progress Notes (Addendum)
[facility] Radiation Oncology Follow up Note  Name: Adam Chambers MRN: [id number]  Date: 07/04/2011  DOB: 1940/09/27  CC:  Ha, huan; perry, byron; butulija, djenita  DIAGNOSIS:Stage III right tonsil cancer  INTERVAL SINCE LAST RADIATION: 16 months    ALLERGIES: Simvastatin   MEDICATIONS:  Current outpatient prescriptions:amLODipine (NORVASC) 5 MG tablet, Take 5 mg by mouth daily.  , Disp: , Rfl: ;  aspirin 81 MG tablet, Take 81 mg by mouth daily.  , Disp: , Rfl: ;  diazepam (VALIUM) 5 MG tablet, Take 5 mg by mouth every 6 (six) hours as needed.  , Disp: , Rfl: ;  doxazosin (CARDURA) 8 MG tablet, Take 8 mg by mouth daily.  , Disp: , Rfl:  flunisolide (NASAREL) 29 MCG/ACT (0.025%) nasal spray, Place 2 sprays into the nose daily as needed. Dose is for each nostril. , Disp: , Rfl: ;  senna (SENOKOT) 8.6 MG TABS, Take 1 tablet by mouth daily as needed.  , Disp: , Rfl: ;  UNABLE TO FIND, daily. Med Name:thyroxine,unknown dose, Disp: , Rfl: ;  vardenafil (LEVITRA) 20 MG tablet, Take 20 mg by mouth daily as needed.  , Disp: , Rfl:    NARRATIVE: pt presents for routine assessement.  Continues to have dry mouth and poor taste.  Taste is a little better compared to last visit.  No odynophagia. Energy level good, walks 3-4 miles/day.   PHYSICAL EXAM:  height is 6\' 4"  (1.93 m) and weight is 180 lb 1.6 oz (81.693 kg). His oral temperature is 97.7 F (36.5 C). His blood pressure is 154/91 and his pulse is 49. His respiration is 20.  Throat: lips, mucosa, and tongue normal; teeth and gums normal Chest is clear, no wheezing or rales. Normal symmetric air entry throughout both lung fields. No chest wall deformities or tenderness.  Indirect mirror exam shows no evidence of recurrence in tonsil area or base of tongue Neck supple without adenopathy, minimal hyperpigmentation changes     IMPRESSION: No evidence of recurrent disease  PLAN: Return to rad/onc in 6 mos.  F/u with ent and  med/onc in the interim.

## 2011-07-04 NOTE — Progress Notes (Signed)
F/u appt, some fatigue at times, walking 2-3 miles daily, still slow on swallowing with plenty liquids behind chicken/meats, bread, softer foods not so difficult, can't drink carbonated sodas,burns,not taste right either stated," has some tase back with mrs. Dash ,supplement, no c/o pain ,back and legs pain at times after walig, slight h/a's in am when first gets up stated by pt

## 2011-07-04 NOTE — Patient Instructions (Addendum)
Sore or Dry Mouth Care A sore or dry mouth may happen for many different reasons. Sometimes, treatment for other health problems may have to stop until your sore or dry mouth gets better.  HOME CARE  Do not smoke or chew tobacco.   Use fake (artificial) saliva when your mouth feels dry.   Use a humidifier in your bedroom at night.   Eat small meals and snacks.   Eat food cold or at room temperature.   Suck on ice-chips or try frozen ice pops or juice bars, ice-cream, and watermelon. Do not have citrus flavors.   Suck on hard, sugarless, sour candy, or chew sugarless gum to help make more saliva.   Eat soft foods such as yogurt, bananas, canned fruit, mashed potatoes, oatmeal, rice, eggs, cottage cheese, macaroni and cheese, jello, and pudding.   Microwave vegetables and fruits to soften them.   Puree cooked food in a blender if needed.   Make dry food moist by using olive oil, gravy, or mild sauces. Dip foods in liquids.   Keep a glass of water or squirt bottle nearby. Take sips often throughout the day.   Limit caffeine.   Avoid:   Pop or fizzy drinks.   Alcohol.   Citrus juices.   Acidic food.   Salty or spicy food.   Foods or drinks that are very hot.   Hard or crunchy food.  Mouth Care  Wash your hands well with soap and water before doing mouth care.   Use fake saliva as told by your doctor.   Use medicine on the sore places.   Brush your teeth at least 2 times a day. Brush after each meal if possible. Rinse your mouth with water after each meal and after drinking a sweet drink.   Brush slowly and gently in small circles. Do not brush side-to-side.   Use regular toothpastes, but stay away from ones that have sodium laurel sulfate in them.   Gargle with a baking soda mouthwash ( teaspoon baking soda mixed in with 4 cups of water).   Gargle with medicated mouthwash.   Use dental floss or dental tape to clean between your teeth every day.   Use a  lanolin-based lip balm to keep your lips from getting dry.   If you wear dentures or bridges:   You may need to leave them out until your doctor tells you to start wearing them again.   Take them out at night if you wear them daily. Soak them in warm water or denture solution. Take your dentures out as much as you can during the day. Take them out when you use mouthwash.   After each meal, brush your gums gently with a soft brush and rinse your mouth with water.   If your dentures rub on your gums and cause a sore spot, have your dentist check and fix your dentures right away.  GET HELP RIGHT AWAY IF:   Your mouth gets more painful or dry.   You have questions.  MAKE SURE YOU:  Understand these instructions.   Will watch your condition.   Will get help right away if you are not doing well or get worse.  Document Released: 06/05/2009 Document Revised: 04/20/2011 Document Reviewed: 06/05/2009 [facility] Patient Information 2012 [place], [place].

## 2011-07-09 ENCOUNTER — Telehealth: Payer: Self-pay | Admitting: Oncology

## 2011-07-09 NOTE — Telephone Encounter (Signed)
Mailed the pt his June 2013 appt calendar

## 2011-07-18 ENCOUNTER — Ambulatory Visit: Payer: Medicare Other | Admitting: Radiation Oncology

## 2011-08-10 ENCOUNTER — Encounter: Payer: Self-pay | Admitting: *Deleted

## 2011-08-18 ENCOUNTER — Telehealth: Payer: Self-pay | Admitting: Oncology

## 2011-08-18 NOTE — Telephone Encounter (Signed)
S/w the pt and he is aware of the r/s appts from dec to jan at his request

## 2011-08-19 ENCOUNTER — Ambulatory Visit: Payer: Medicare Other | Admitting: Oncology

## 2011-08-19 ENCOUNTER — Other Ambulatory Visit: Payer: Medicare Other | Admitting: Lab

## 2011-08-24 ENCOUNTER — Ambulatory Visit: Payer: Medicare Other | Admitting: Oncology

## 2011-08-25 ENCOUNTER — Other Ambulatory Visit (HOSPITAL_BASED_OUTPATIENT_CLINIC_OR_DEPARTMENT_OTHER): Payer: Medicare Other | Admitting: Lab

## 2011-08-25 ENCOUNTER — Ambulatory Visit (HOSPITAL_BASED_OUTPATIENT_CLINIC_OR_DEPARTMENT_OTHER): Payer: Medicare Other | Admitting: Oncology

## 2011-08-25 ENCOUNTER — Telehealth: Payer: Self-pay | Admitting: Oncology

## 2011-08-25 VITALS — BP 187/114 | HR 67 | Temp 98.1°F | Ht 76.0 in | Wt 177.2 lb

## 2011-08-25 DIAGNOSIS — C099 Malignant neoplasm of tonsil, unspecified: Secondary | ICD-10-CM

## 2011-08-25 DIAGNOSIS — I10 Essential (primary) hypertension: Secondary | ICD-10-CM

## 2011-08-25 DIAGNOSIS — E039 Hypothyroidism, unspecified: Secondary | ICD-10-CM

## 2011-08-25 DIAGNOSIS — Z85819 Personal history of malignant neoplasm of unspecified site of lip, oral cavity, and pharynx: Secondary | ICD-10-CM

## 2011-08-25 LAB — COMPREHENSIVE METABOLIC PANEL
AST: 15 U/L (ref 0–37)
BUN: 20 mg/dL (ref 6–23)
Calcium: 9.4 mg/dL (ref 8.4–10.5)
Chloride: 101 mEq/L (ref 96–112)
Creatinine, Ser: 1.48 mg/dL — ABNORMAL HIGH (ref 0.50–1.35)
Glucose, Bld: 81 mg/dL (ref 70–99)

## 2011-08-25 LAB — CBC WITH DIFFERENTIAL/PLATELET
Basophils Absolute: 0 10*3/uL (ref 0.0–0.1)
EOS%: 1.9 % (ref 0.0–7.0)
HCT: 42.6 % (ref 38.4–49.9)
HGB: 14.6 g/dL (ref 13.0–17.1)
MCH: 29 pg (ref 27.2–33.4)
MCV: 84.9 fL (ref 79.3–98.0)
NEUT%: 76.7 % — ABNORMAL HIGH (ref 39.0–75.0)
lymph#: 0.4 10*3/uL — ABNORMAL LOW (ref 0.9–3.3)

## 2011-08-25 LAB — TSH: TSH: 5.668 u[IU]/mL — ABNORMAL HIGH (ref 0.350–4.500)

## 2011-08-25 MED ORDER — LEVOTHYROXINE SODIUM 25 MCG PO TABS: 25.0000 ug | ORAL_TABLET | Freq: Every day | ORAL | Status: DC

## 2011-08-25 NOTE — Telephone Encounter (Signed)
Pt already had appt set for 02/02/12 and wishes to keep it as is.  Printed for pt   aom

## 2011-08-25 NOTE — Progress Notes (Signed)
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  HPV status not determined, samples have not been released by the [place].  PAST THERAPY:  He is status post one cycle of cisplatin back on 12/28/2009, however, due to acute renal insufficiency with other toxicity related to the cisplatin we changed his regimen to Taxol, carboplatin which he received on 02/02/2010 and 02/09/2010.  He also had a course of radiation therapy from 12/28/2009 through 02/09/2010.  CURRENT THERAPY:  watchful observation.  INTERVAL HISTORY: Adam Chambers 71 y.o. male returns for regular follow up.  He went to [place] for the holidays.  He developed URI and has been taking over-the-counter medication which he thinks causing his high blood pressure today. His normal blood pressure is 130s over 80s. He still has xerostomia and lack of normal taste. However he is eating as much as he can and has to maintain his weight. He exercised a few times a week especially walking upward to 4-5 miles a day. He denies any severe fatigue. He is independent of all activities of daily living. He denies any dysphagia, odynophagia, cervical neck node swelling.  Patient denies headache, visual changes, confusion, drenching night sweats, mucositis, nausea vomiting, jaundice, chest pain, palpitation, shortness of breath, dyspnea on exertion, productive cough, gum bleeding, epistaxis, hematemesis, hemoptysis, abdominal pain, abdominal swelling, early satiety, melena, hematochezia, hematuria, skin rash, spontaneous bleeding, joint swelling, joint pain, heat or cold intolerance, bowel bladder incontinence, back pain, focal motor weakness, paresthesia, depression, suicidal or homocidal ideation, feeling hopelessness.   MEDICAL  HISTORY: Past Medical History  Diagnosis Date  . 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 Hx of radiation therapy 5/9/211-02/09/2010    rad tx tonsillar region   . History of chemotherapy 12/28/09 & then again 02/02/10 & 6/21/211    1 cycle cisplatin, then taxol & carboplatin  . Spinal stenosis     cervical and lumbar  . Blood transfusion   . CHF (congestive heart failure)   . Hepatitis 1995    food?  . BPH (benign prostatic hypertrophy)   . Chronic back pain   . Allergic rhinitis   . Otitis media     left eye  . Hypertension   . Thyroid disease     secondary to radiation therapy    SURGICAL HISTORY:  Past Surgical History  Procedure Date  . Cervical fusion   . Refractive surgery     left eye  . Gastrostomy tube placement     for nutrition /hydration , s/p mucositis assoc with odynophagia&dysphasia    MEDICATIONS: Current Outpatient Prescriptions  Medication Sig Dispense Refill  . amLODipine (NORVASC) 5 MG tablet Take 5 mg by mouth daily.        Marland Kitchen aspirin 81 MG tablet Take 81 mg by mouth daily.        . diazepam (VALIUM) 5 MG tablet Take 5 mg by mouth every 6 (six) hours as needed.        . doxazosin (CARDURA) 8 MG tablet Take 8 mg by mouth daily.        . flunisolide (NASAREL) 29 MCG/ACT (0.025%) nasal spray Place 2 sprays into the nose daily as needed. Dose is for each nostril.       Marland Kitchen senna (SENOKOT) 8.6 MG  TABS Take 1 tablet by mouth daily as needed.        . vardenafil (LEVITRA) 20 MG tablet Take 20 mg by mouth daily as needed.        Marland Kitchen levothyroxine (LEVOTHROID) 25 MCG tablet Take 1 tablet (25 mcg total) by mouth daily.  90 tablet  3    ALLERGIES:  is allergic to simvastatin.  REVIEW OF SYSTEMS:  The rest of the 14-point review of system was negative.   Filed Vitals:   08/25/11 1346  BP: 187/114  Pulse: 67  Temp: 98.1 F (36.7 C)   Wt Readings from Last 3 Encounters:  08/25/11 177 lb 3.2 oz (80.377 kg)  03/14/11 173 lb  3.2 oz (78.563 kg)  07/04/11 180 lb 1.6 oz (81.693 kg)   ECOG Performance status: 0-1  PHYSICAL EXAMINATION:   General:  well-nourished in no acute distress.  Eyes:  no scleral icterus.  ENT:  There were no oropharyngeal lesions on my unaided exam.  Neck was without thyromegaly.  Lymphatics:  Negative cervical, supraclavicular or axillary adenopathy.  Respiratory: lungs were clear bilaterally without wheezing or crackles.  Cardiovascular:  Regular rate and rhythm, S1/S2, without murmur, rub or gallop.  There was no pedal edema.  GI:  abdomen was soft, flat, nontender, nondistended, without organomegaly.  Muscoloskeletal:  no spinal tenderness of palpation of vertebral spine.  Skin exam was without echymosis, petichae.  Neuro exam was nonfocal.  Patient was able to get on and off exam table without assistance.  Gait was normal.  Patient was alerted and oriented.  Attention was good.   Language was appropriate.  Mood was normal without depression.  Speech was not pressured.  Thought content was not tangential.     LABORATORY/RADIOLOGY DATA: WBC 3.9; Hgb 14.6; Plt 278. CMET pending; TSH pending.     ASSESSMENT AND PLAN:   1. History of oropharyngeal squamous cell carcinoma:  I discussed with Adam Chambers that he has to evidence of disease recurrence or metastases on clinical history, physical exam, laboratory tests and as he is almost 2 years out from the finish of therapy there is no indications for routine surveillance CT scan unless he has any concerning symptoms which does not have today. I advised him to follow with ENT radiation oncology who can perform an office endoscopy to ensure there's no local recurrence. 2. 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  He is on levothyroxine.  His TSH today is pending.  I will adjust this dose as needed.  3. Hypertension.  Slightly elevated systolic and diastolic blood pressure. Again he was very adamant that his cervical pressure is due to  his cold medications. I advised Adam Chambers to check his blood pressure at home daily and contact his PCP at the [facility] if his blood pressure still higher than 130s over 80s 4. Primary care:  He said that he is due for a colonoscopy this year with [facility]. 5. Followup:  Follow-up with me in 6 months.  I advised him to let us know if he has problem with dysphagia, odynophagia, or any concerning symptoms. I advised him to follow with ENT and radiation oncology as well in between visits [place].

## 2011-12-26 ENCOUNTER — Ambulatory Visit
Admission: RE | Admit: 2011-12-26 | Discharge: 2011-12-26 | Disposition: A | Discharge status: Home / Self Care | Payer: Medicare Other | Source: Ambulatory Visit | Attending: Radiation Oncology | Admitting: Radiation Oncology

## 2011-12-26 ENCOUNTER — Encounter: Payer: Self-pay | Admitting: Radiation Oncology

## 2011-12-26 VITALS — BP 172/106 | HR 49 | Temp 97.5°F | Resp 18 | Wt 186.3 lb

## 2011-12-26 DIAGNOSIS — C099 Malignant neoplasm of tonsil, unspecified: Secondary | ICD-10-CM

## 2011-12-26 NOTE — Progress Notes (Signed)
Radiation Oncology         ([phone]) [phone] ________________________________  Name: PHOENIX DRESSER MRN: [id number]  Date: 12/26/2011  DOB: 02/05/1941  Follow-Up Visit Note  CC: No primary provider on file.  Exie Parody, MD  Diagnosis:   Tonsillar Cancer  Interval Since Last Radiation:  23 months  Narrative:  The patient returns today for routine follow-up.  He seems to be doing recently well. Patient continues to have some difficulties with eating meats. His taste has improved but is not his baseline. He has also had some intolerance to hot spicy foods.   He did have a PET scan at the [facility] in [place] which showed no evidence of recurrence or residual disease.                           ALLERGIES:  is allergic to simvastatin.  Meds: Current Outpatient Prescriptions  Medication Sig Dispense Refill  . amLODipine (NORVASC) 5 MG tablet Take 5 mg by mouth daily.        Marland Kitchen aspirin 81 MG tablet Take 81 mg by mouth daily.        . diazepam (VALIUM) 5 MG tablet Take 5 mg by mouth every 6 (six) hours as needed.        . doxazosin (CARDURA) 8 MG tablet Take 8 mg by mouth daily.        . flunisolide (NASAREL) 29 MCG/ACT (0.025%) nasal spray Place 2 sprays into the nose daily as needed. Dose is for each nostril.       Marland Kitchen levothyroxine (LEVOTHROID) 25 MCG tablet Take 1 tablet (25 mcg total) by mouth daily.  90 tablet  3  . senna (SENOKOT) 8.6 MG TABS Take 1 tablet by mouth daily as needed.        . vardenafil (LEVITRA) 20 MG tablet Take 20 mg by mouth daily as needed.          Physical Findings: The patient is in no acute distress. Patient is alert and oriented.  weight is 186 lb 4.8 oz (84.505 kg). His oral temperature is 97.5 F (36.4 C). His blood pressure is 172/106 and his pulse is 49. His respiration is 18. .  No no palpable cervical supraclavicular or axillary adenopathy. The oral cavity is free of secondary infection or mucosal lesion. The mucosa is somewhat dry appear. Direct visualization  of the tonsil area shows no visible signs recurrence. Palpation along the base of tongue and tonsillar area reveals no suspicious induration. The patient proceeded to undergo indirect mirror examination here there are no mucosal lesions noted in the base of tongue or pharyngeal area. The vocal cords move well on exam. I was unable to view the entire extent of the vocal cords on exam today.  Lab Findings: Lab Results  Component Value Date   WBC 3.9* 08/25/2011   HGB 14.6 08/25/2011   HCT 42.6 08/25/2011   MCV 84.9 08/25/2011   PLT 278 08/25/2011    @LASTCHEM @  Radiographic Findings: No results found.  Impression:  The patient is recovering from the effects of radiation.  The patient is clinically and radiographically NED. His blood pressure is elevated but the patient forgot to take his medication prior to coming in today. He will  take this immediately once at home.  Plan:  Followup in 3 months. The patient be seen in ENT in approximately a month at the [facility].  _____________________________________  -----------------------------------  Billie Lade, PhD, MD

## 2011-12-26 NOTE — Progress Notes (Signed)
HERE TODAY FOR FU TONSILAR CA.  SPEECH IS GOOD , STILL GETS "CHOKED" EATING SOLID FOODS.  STILL HAS NOT MUCH TASTE AT ALL.  SOME FOODS REALLY BURN BAD , LIKE SODAS.  SKIN LOOKS GREAT.  HAS SOME PROBLEMS WITH STOMACH PAIN, ACHING, IF BENDS OVER HAS TO EASE UP TO KEEP STOMACH FROM HURTING, THINKS FROM EATING SPICY FOODS TRYING TO GET TASTE BACK.  HAS LAID OFF THE SPICY STUFF AS MUCH, NOT HURTING TODAY.  SAYS ONLY OTHER PROBLEM IS JUST NORMAL ACHES AND PAINS.  HAS SOME ISSUES WITH DIARRHEA, TAKES STOOL SOFTENER DAILY.Marland Kitchen

## 2012-01-02 ENCOUNTER — Ambulatory Visit: Payer: Medicare Other | Admitting: Radiation Oncology

## 2012-01-09 ENCOUNTER — Ambulatory Visit: Payer: Medicare Other | Admitting: Radiation Oncology

## 2012-01-16 ENCOUNTER — Ambulatory Visit: Payer: Medicare Other | Admitting: Radiation Oncology

## 2012-02-02 ENCOUNTER — Telehealth: Payer: Self-pay | Admitting: Oncology

## 2012-02-02 ENCOUNTER — Ambulatory Visit (HOSPITAL_BASED_OUTPATIENT_CLINIC_OR_DEPARTMENT_OTHER): Payer: Medicare Other | Admitting: Oncology

## 2012-02-02 ENCOUNTER — Encounter: Payer: Self-pay | Admitting: Oncology

## 2012-02-02 ENCOUNTER — Other Ambulatory Visit (HOSPITAL_BASED_OUTPATIENT_CLINIC_OR_DEPARTMENT_OTHER): Payer: Medicare Other | Admitting: Lab

## 2012-02-02 VITALS — BP 150/88 | HR 53 | Temp 97.0°F | Ht 76.0 in | Wt 185.1 lb

## 2012-02-02 DIAGNOSIS — C099 Malignant neoplasm of tonsil, unspecified: Secondary | ICD-10-CM

## 2012-02-02 DIAGNOSIS — G8929 Other chronic pain: Secondary | ICD-10-CM

## 2012-02-02 DIAGNOSIS — N289 Disorder of kidney and ureter, unspecified: Secondary | ICD-10-CM

## 2012-02-02 DIAGNOSIS — M549 Dorsalgia, unspecified: Secondary | ICD-10-CM

## 2012-02-02 DIAGNOSIS — C779 Secondary and unspecified malignant neoplasm of lymph node, unspecified: Secondary | ICD-10-CM

## 2012-02-02 DIAGNOSIS — E039 Hypothyroidism, unspecified: Secondary | ICD-10-CM

## 2012-02-02 DIAGNOSIS — C50919 Malignant neoplasm of unspecified site of unspecified female breast: Secondary | ICD-10-CM

## 2012-02-02 DIAGNOSIS — R5381 Other malaise: Secondary | ICD-10-CM

## 2012-02-02 HISTORY — DX: Hypothyroidism, unspecified: E03.9

## 2012-02-02 LAB — COMPREHENSIVE METABOLIC PANEL
Albumin: 4 g/dL (ref 3.5–5.2)
BUN: 21 mg/dL (ref 6–23)
CO2: 28 mEq/L (ref 19–32)
Calcium: 9.4 mg/dL (ref 8.4–10.5)
Chloride: 105 mEq/L (ref 96–112)
Glucose, Bld: 85 mg/dL (ref 70–99)
Potassium: 4.8 mEq/L (ref 3.5–5.3)
Sodium: 139 mEq/L (ref 135–145)
Total Protein: 6.7 g/dL (ref 6.0–8.3)

## 2012-02-02 LAB — CBC WITH DIFFERENTIAL/PLATELET
Basophils Absolute: 0 10*3/uL (ref 0.0–0.1)
Eosinophils Absolute: 0.1 10*3/uL (ref 0.0–0.5)
HGB: 14.5 g/dL (ref 13.0–17.1)
MCV: 84.4 fL (ref 79.3–98.0)
MONO#: 0.4 10*3/uL (ref 0.1–0.9)
NEUT#: 2.8 10*3/uL (ref 1.5–6.5)
RDW: 14.4 % (ref 11.0–14.6)
WBC: 3.9 10*3/uL — ABNORMAL LOW (ref 4.0–10.3)
lymph#: 0.5 10*3/uL — ABNORMAL LOW (ref 0.9–3.3)

## 2012-02-02 MED ORDER — OXYCODONE-ACETAMINOPHEN 10-325 MG PO TABS: 1.0000 | ORAL_TABLET | ORAL | Status: AC | PRN

## 2012-02-02 NOTE — Telephone Encounter (Signed)
appts made and printed for pt aom °

## 2012-02-02 NOTE — Progress Notes (Signed)
[facility]  [phone] [phone]   OFFICE PROGRESS NOTE   Cc:  No primary provider on file.  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 HPV status not determined, samples have not been released by the [place].   PAST THERAPY: He is status post one cycle of cisplatin back on 12/28/2009, however, due to acute renal insufficiency with other toxicity related to the cisplatin we changed his regimen to Taxol, carboplatin which he received on 02/02/2010 and 02/09/2010. He also had a course of radiation therapy from 12/28/2009 through 02/09/2010.   CURRENT THERAPY: watchful observation.   INTERVAL HISTORY: Adam Chambers 71 y.o. male returns for regular follow up by himself.  He reports having some fatigue; however, he is independent of all activities of daily living. He is able to ambulate everyday about half an hour. He still has normal appetite and normal weight. His xerostomia is improved compared to before. He is able to eat all type of foods without restriction.  He has diffuse bone pain from history of DJD, OA and back surgeries. He was taking Percocet 10/325 mg; however, he ran out of this last few weeks and has been having worsening pain in decreased his activity level.  He would not see his PCP. at the [facility] for another 4 weeks. He reports bilateral neck stiffness however no discrete mass. He does not have dysphasia, odynophagia, nausea vomiting.  Patient denies headache, visual changes, confusion, drenching night sweats, palpable lymph node swelling, mucositis, odynophagia, dysphagia, nausea vomiting, jaundice, chest pain, palpitation, shortness of breath, dyspnea on exertion, productive cough, gum bleeding, epistaxis, hematemesis, hemoptysis, abdominal pain, abdominal swelling, early  satiety, melena, hematochezia, hematuria, skin rash, spontaneous bleeding, joint swelling, heat or cold intolerance, bowel bladder incontinence, focal motor weakness, paresthesia, depression, suicidal or homocidal ideation, feeling hopelessness.   Past Medical History  Diagnosis Date  . 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 Hx of radiation therapy 5/9/211-02/09/2010    rad tx tonsillar region   . History of chemotherapy 12/28/09 & then again 02/02/10 & 6/21/211    1 cycle cisplatin, then taxol & carboplatin  . Spinal stenosis     cervical and lumbar  . Blood transfusion   . CHF (congestive heart failure)   . Hepatitis 1995    food?  . BPH (benign prostatic hypertrophy)   . Chronic back pain   . Allergic rhinitis   . Otitis media     left eye  . Hypertension   . Thyroid disease     secondary to radiation therapy    Past Surgical History  Procedure Date  . Cervical fusion   . Refractive surgery     left eye  . Gastrostomy tube placement     for nutrition /hydration , s/p mucositis assoc with odynophagia&dysphasia    Current Outpatient Prescriptions  Medication Sig Dispense Refill  . amLODipine (NORVASC) 5 MG tablet Take 5 mg by mouth daily.        Marland Kitchen aspirin 81 MG tablet Take 81 mg by mouth daily.        . diazepam (VALIUM) 5 MG tablet Take 5 mg by mouth every 6 (six) hours as needed.        . doxazosin (CARDURA) 8 MG tablet Take 8 mg by mouth daily.        Marland Kitchen  flunisolide (NASAREL) 29 MCG/ACT (0.025%) nasal spray Place 2 sprays into the nose daily as needed. Dose is for each nostril.       Marland Kitchen levothyroxine (LEVOTHROID) 25 MCG tablet Take 1 tablet (25 mcg total) by mouth daily.  90 tablet  3  . senna (SENOKOT) 8.6 MG TABS Take 1 tablet by mouth daily as needed.        . vardenafil (LEVITRA) 20 MG tablet Take 20 mg by mouth daily as needed.          ALLERGIES:  is allergic to simvastatin.  REVIEW OF SYSTEMS:  The rest of the 14-point review  of system was negative.   Filed Vitals:   02/02/12 1326  BP: 150/88  Pulse: 53  Temp: 97 F (36.1 C)   Wt Readings from Last 3 Encounters:  02/02/12 185 lb 1.6 oz (83.961 kg)  12/26/11 186 lb 4.8 oz (84.505 kg)  08/25/11 177 lb 3.2 oz (80.377 kg)   ECOG Performance status: 1  PHYSICAL EXAMINATION:  General: well-nourished man in no acute distress. Eyes: no scleral icterus. ENT: There were no oropharyngeal lesions on my unaided exam. Neck was without thyromegaly. Lymphatics: Negative cervical, supraclavicular or axillary adenopathy. Respiratory: lungs were clear bilaterally without wheezing or crackles. Cardiovascular: Regular rate and rhythm, S1/S2, without murmur, rub or gallop. There was no pedal edema. GI: abdomen was soft, flat, nontender, nondistended, without organomegaly. Muscoloskeletal: no spinal tenderness of palpation of vertebral spine. Skin exam was without echymosis, petichae. Neuro exam was nonfocal. Patient was able to get on and off exam table without assistance. Gait was normal. Patient was alerted and oriented. Attention was good. Language was appropriate. Mood was normal without depression. Speech was not pressured. Thought content was not tangential.    LABORATORY/RADIOLOGY DATA:  Lab Results  Component Value Date   WBC 3.9* 02/02/2012   HGB 14.5 02/02/2012   HCT 42.7 02/02/2012   PLT 181 02/02/2012   GLUCOSE 81 08/25/2011   ALKPHOS 56 08/25/2011   ALT 8 08/25/2011   AST 15 08/25/2011   NA 140 08/25/2011   K 4.7 08/25/2011   CL 101 08/25/2011   CREATININE 1.48* 08/25/2011   BUN 20 08/25/2011   CO2 33* 08/25/2011   INR 1.11 SLIGHT HEMOLYSIS 01/20/2010   PET scan from [place] in 09/2011 was negative per report.  I could not opened the forwarded CD rom for review due to incompatibility of program.  ASSESSMENT AND PLAN:   1. History of oropharyngeal squamous cell carcinoma: I discussed with Adam Chambers that he has to evidence of disease recurrence or metastases on clinical history,  physical exam, laboratory tests and PET scan.  As he is 2 years out from the finish of therapy there is no indications for routine surveillance scan unless he has any concerning symptoms.  He has bilateral neck discomfort however could not find any mass. He was seen by Dr. Roselind Messier earlier last month and didn't see any finding on examination. His PET scan in February was negative. He is seeing a new ENT physician at the [place] with the next few months and I advised him to have an ENT examination of his oropharynx ensure no recurrent disease. 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  I advised him to the next scheduled exercise as needed.  2. 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 He is on levothyroxine. His TSH today was still elevated. I advised him to increase his  Synthroid from 25 mcg to 50 mcg by mouth daily 3. Hypertension. He is on amlodipine per PCP. 4. Benign prostatic hypertrophy: He is on Doxil Zosyn per PCP. 5. Risk reduction: He does not drink alcohol, smoke cigarettes, chew tobacco. 6. Diffuse osteoarthritis and bone pain: This predated the cancer diagnosis. He ran out of pain medications will be to see his PCP for another month. He requested pain medication. I advised him that I can only give him one-month supply of Percocet 10/325 take every 6 hours as needed for pain. I prescribed 60 tablets and no refill. He promised to follow up with his PCP next month for chronic refill.  7. Followup: Follow-up with medical oncology at the cancer Center in about one year. I advised him to also followup with ENT and radiation oncology in between visits with [place].

## 2012-02-02 NOTE — Patient Instructions (Addendum)
1. History of tonsil cancer: In remission. After 2 years from finish of radiation and chemotherapy, there is no indication to repeat routine CT scan unless there are symptoms. Continue to follow up with Ears/Nose/Throat physician for at least once or twice annual examinations. 2. Please continue to refrain from smoking, chewing tobacco, drinking alcohol which can increase the risk of recurrence tonsil cancer. 3. Hypothyroidism: Continue with Synthroid. 4. Followup in one year (but sooner if concerning symptoms).  

## 2012-02-03 ENCOUNTER — Telehealth: Payer: Self-pay | Admitting: *Deleted

## 2012-02-03 NOTE — Telephone Encounter (Signed)
Called pt to inform him of increase in Synthroid.  Pt reports he has not been consistently taking the current dose of Synthroid 25 mcg and knows he had not taken it for at least the 2 to 3 days prior to lab/office visit.  He says he will be compliant w/ taking synthroid 25 mcg daily and does not want to increase dose at this time since he has not really been taking it regularly.   Instructed pt to take daily every day and we will still need to recheck his thyroid level in 2 months. Scheduling will call w/ appt.. He agreed/verbalized understanding.

## 2012-02-03 NOTE — Telephone Encounter (Signed)
Message copied by Wende Mott on Fri Feb 03, 2012  4:12 PM ------      Message from: HA, Raliegh Ip T      Created: Thu Feb 02, 2012  8:07 PM       Please call patient advised him to increase the Synthroid to 50 mcg by mouth daily because the dose that he is still on insufficient dose of Synthroid at this time.  I will need to have him come back in in 2 months, 6 months, and 9 months to recheck his Synthroid level. Thank you

## 2012-02-06 ENCOUNTER — Telehealth: Payer: Self-pay | Admitting: Oncology

## 2012-02-06 NOTE — Telephone Encounter (Signed)
Added lb appts. S/w pt re appts for 05/09/12, 08/03/12 and 11/01/12. Also confirmed appt for 01/29/13.

## 2012-03-26 ENCOUNTER — Encounter: Payer: Self-pay | Admitting: Internal Medicine

## 2012-04-12 ENCOUNTER — Encounter: Payer: Self-pay | Admitting: Internal Medicine

## 2012-04-23 ENCOUNTER — Ambulatory Visit: Payer: Medicare Other | Admitting: Radiation Oncology

## 2012-04-29 NOTE — Progress Notes (Signed)
Rescheduled

## 2012-05-04 ENCOUNTER — Other Ambulatory Visit: Payer: Medicare Other

## 2012-05-07 ENCOUNTER — Telehealth: Payer: Self-pay | Admitting: *Deleted

## 2012-05-07 ENCOUNTER — Ambulatory Visit (HOSPITAL_BASED_OUTPATIENT_CLINIC_OR_DEPARTMENT_OTHER): Payer: Medicare Other | Admitting: Lab

## 2012-05-07 ENCOUNTER — Encounter: Payer: Self-pay | Admitting: Radiation Oncology

## 2012-05-07 ENCOUNTER — Ambulatory Visit
Admission: RE | Admit: 2012-05-07 | Discharge: 2012-05-07 | Disposition: A | Discharge status: Home / Self Care | Payer: Medicare Other | Source: Ambulatory Visit | Attending: Radiation Oncology | Admitting: Radiation Oncology

## 2012-05-07 VITALS — BP 197/112 | HR 61 | Temp 98.4°F | Wt 184.8 lb

## 2012-05-07 DIAGNOSIS — C099 Malignant neoplasm of tonsil, unspecified: Secondary | ICD-10-CM

## 2012-05-07 DIAGNOSIS — G8929 Other chronic pain: Secondary | ICD-10-CM

## 2012-05-07 DIAGNOSIS — E039 Hypothyroidism, unspecified: Secondary | ICD-10-CM

## 2012-05-07 DIAGNOSIS — M549 Dorsalgia, unspecified: Secondary | ICD-10-CM

## 2012-05-07 LAB — TSH: TSH: 3.951 u[IU]/mL (ref 0.350–4.500)

## 2012-05-07 MED ORDER — LEVOTHYROXINE SODIUM 50 MCG PO TABS: 50.0000 ug | ORAL_TABLET | Freq: Every day | ORAL | Status: DC

## 2012-05-07 NOTE — Telephone Encounter (Signed)
Message copied by Wende Mott on Mon May 07, 2012  4:49 PM ------      Message from: HA, Raliegh Ip T      Created: Mon May 07, 2012  4:30 PM       Please call pt.  His TSH is within normal range.  Please advise him to continue Synthroid at current dose.  Thanks.

## 2012-05-07 NOTE — Progress Notes (Signed)
Patient here for routine follow up completion of tonsillar radiation 02/09/2010.Has some soreness of neck on touch.Continued dry mouth.Must moisten food prior to swallowing. Chronic back pain level "8" today.Blood pressure elevated which may be reason patient feels bad .Patient hasn"t taken blood pressure medication since Saturday but will take when he gets home.

## 2012-05-07 NOTE — Progress Notes (Signed)
Radiation Oncology         [phone] ________________________________  Name: Adam Chambers MRN: [id number]  Date: 05/07/2012  DOB: 1940/11/17  Follow-Up Visit Note  CC: No primary provider on file.  No ref. 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  He continues to have some problems with xerostomia which interferes with good taste. He has some difficulties with swallowing  some meats. With the water however he is able to eat most any foods that he desires.   He has chronic arthritic discomfort. He does have mild pain in the neck region. he denies any pain with swallowing he denies any ear pain. He continues to follow with Dr. Gaylyn Rong as well as the [facility] ENT service.  He is on levothyroxine I likely related to radiation-induced hypothyroidism.  He does have some mild dizziness and headaches day which is likely related to his elevated blood pressure. The patient has failed to take his blood pressure medication in the past 2 days. I have asked him to take this once he is home today.                       ALLERGIES:  is allergic to simvastatin.  Meds: Current Outpatient Prescriptions  Medication Sig Dispense Refill  . amLODipine (NORVASC) 5 MG tablet Take 5 mg by mouth daily.        Marland Kitchen aspirin 81 MG tablet Take 81 mg by mouth daily.        . diazepam (VALIUM) 5 MG tablet Take 5 mg by mouth every 6 (six) hours as needed.        . doxazosin (CARDURA) 8 MG tablet Take 8 mg by mouth daily.        . flunisolide (NASAREL) 29 MCG/ACT (0.025%) nasal spray Place 2 sprays into the nose daily as needed. Dose is for each nostril.       Marland Kitchen levothyroxine (LEVOTHROID) 25 MCG tablet Take 1 tablet (25 mcg  total) by mouth daily.  90 tablet  3  . senna (SENOKOT) 8.6 MG TABS Take 1 tablet by mouth daily as needed.        . vardenafil (LEVITRA) 20 MG tablet Take 20 mg by mouth daily as needed.          Physical Findings: The patient is in no acute distress. Patient is alert and oriented.  weight is 184 lb 12.8 oz (83.825 kg). His temperature is 98.4 F (36.9 C). His blood pressure is 197/112 and his pulse is 61. .  No no palpable cervical subclavicular or axillary adenopathy. The lungs are clear to auscultation. The heart has regular rhythm and rate. He has minimal tenderness with palpation along the neck at this time. The nasal cavity reveals some mild xerostomia. There are no mucosal lesions noted. Patient proceeded to undergo an indirect mirror examination. There are no mucosal lesions noted along the base of tongue or vallecula area. The patient's vocal cords moved well on exam. There's no signs of recurrence along the tonsillar region. On palpation there are no suspicious masses noted in the base of tongue or pharyngeal area.  Lab Findings: Lab Results  Component Value Date   WBC 3.9* 02/02/2012  HGB 14.5 02/02/2012   HCT 42.7 02/02/2012   MCV 84.4 02/02/2012   PLT 181 02/02/2012    @LASTCHEM @  Radiographic Findings: No results found.  Impression:  The patient is recovering from the effects of radiation.  He continues to have problems with taste and xerostomia as above.  No signs recurrence on clinical exam today.  Plan:  Routine followup in 6 months.  _____________________________________   Billie Lade, PhD, MD

## 2012-05-07 NOTE — Telephone Encounter (Signed)
Called pt and instructed to continue current dose of thyroid medication.  Pt states he has been taking the 50 mcg pills and will need refill on those.  He still has some 25 mcg pills left.  Instructed pt he may take Two of the 25 mcg pills to equal 50 mcg and we will send refill for the 50 mcg.  Instructed to call if any problems or questions w/ medication.  He verbalized understanding.

## 2012-05-09 ENCOUNTER — Other Ambulatory Visit: Payer: Medicare Other | Admitting: Lab

## 2012-05-09 ENCOUNTER — Ambulatory Visit: Payer: Medicare Other

## 2012-05-22 ENCOUNTER — Ambulatory Visit (AMBULATORY_SURGERY_CENTER): Payer: Medicare Other

## 2012-05-22 VITALS — Ht 76.0 in | Wt 191.3 lb

## 2012-05-22 DIAGNOSIS — Z1211 Encounter for screening for malignant neoplasm of colon: Secondary | ICD-10-CM

## 2012-05-22 MED ORDER — NA SULFATE-K SULFATE-MG SULF 17.5-3.13-1.6 GM/177ML PO SOLN: 1.0000 | Freq: Once | ORAL | Status: DC

## 2012-06-05 ENCOUNTER — Ambulatory Visit (AMBULATORY_SURGERY_CENTER): Payer: Medicare Other | Admitting: Internal Medicine

## 2012-06-05 ENCOUNTER — Encounter: Payer: Self-pay | Admitting: Internal Medicine

## 2012-06-05 VITALS — BP 155/89 | HR 53 | Temp 97.0°F | Resp 15 | Ht 76.0 in | Wt 191.0 lb

## 2012-06-05 DIAGNOSIS — D126 Benign neoplasm of colon, unspecified: Secondary | ICD-10-CM

## 2012-06-05 DIAGNOSIS — Z1211 Encounter for screening for malignant neoplasm of colon: Secondary | ICD-10-CM

## 2012-06-05 MED ORDER — SODIUM CHLORIDE 0.9 % IV SOLN: 500.0000 mL | INTRAVENOUS | Status: DC

## 2012-06-05 NOTE — Progress Notes (Signed)
No complaints noted in the recovery room. Maw   

## 2012-06-05 NOTE — Progress Notes (Signed)
Patient did not experience any of the following events: a burn prior to discharge; a fall within the facility; wrong site/side/patient/procedure/implant event; or a hospital transfer or hospital admission upon discharge from the facility. (G8907) Patient did not have preoperative order for IV antibiotic SSI prophylaxis. (G8918)  

## 2012-06-05 NOTE — Patient Instructions (Addendum)
One tiny polyp was removed today. Do not worry about this. I will send you a letter about the pathology results and recommendations.  Thank you for choosing me and [facility].  Iva Boop, MD,    YOU HAD AN ENDOSCOPIC PROCEDURE TODAY AT THE [facility]: Refer to the procedure report that was given to you for any specific questions about what was found during the examination.  If the procedure report does not answer your questions, please call your gastroenterologist to clarify.  If you requested that your care partner not be given the details of your procedure findings, then the procedure report has been included in a sealed envelope for you to review at your convenience later.  YOU SHOULD EXPECT: Some feelings of bloating in the abdomen. Passage of more gas than usual.  Walking can help get rid of the air that was put into your GI tract during the procedure and reduce the bloating. If you had a lower endoscopy (such as a colonoscopy or flexible sigmoidoscopy) you may notice spotting of blood in your stool or on the toilet paper. If you underwent a bowel prep for your procedure, then you may not have a normal bowel movement for a few days.  DIET: Your first meal following the procedure should be a light meal and then it is ok to progress to your normal diet.  A half-sandwich or bowl of soup is an example of a good first meal.  Heavy or fried foods are harder to digest and may make you feel nauseous or bloated.  Likewise meals heavy in dairy and vegetables can cause extra gas to form and this can also increase the bloating.  Drink plenty of fluids but you should avoid alcoholic beverages for 24 hours.  ACTIVITY: Your care partner should take you home directly after the procedure.  You should plan to take it easy, moving slowly for the rest of the day.  You can resume normal activity the day after the procedure however you should NOT DRIVE or use heavy machinery for 24  hours (because of the sedation medicines used during the test).    SYMPTOMS TO REPORT IMMEDIATELY: A gastroenterologist can be reached at any hour.  During normal business hours, 8:30 AM to 5:00 PM Monday through Friday, call [phone].  After hours and on weekends, please call the GI answering service at [phone] who will take a message and have the physician on call contact you.   Following lower endoscopy (colonoscopy or flexible sigmoidoscopy):  Excessive amounts of blood in the stool  Significant tenderness or worsening of abdominal pains  Swelling of the abdomen that is new, acute  Fever of 100F or higher     FOLLOW UP: If any biopsies were taken you will be contacted by phone or by letter within the next 1-3 weeks.  Call your gastroenterologist if you have not heard about the biopsies in 3 weeks.  Our staff will call the home number listed on your records the next business day following your procedure to check on you and address any questions or concerns that you may have at that time regarding the information given to you following your procedure. This is a courtesy call and so if there is no answer at the home number and we have not heard from you through the emergency physician on call, we will assume that you have returned to your regular daily activities without incident.  SIGNATURES/CONFIDENTIALITY: You and/or your care partner  have signed paperwork which will be entered into your electronic medical record.  These signatures attest to the fact that that the information above on your After Visit Summary has been reviewed and is understood.  Full responsibility of the confidentiality of this discharge information lies with you and/or your care-partner.

## 2012-06-05 NOTE — Progress Notes (Signed)
The pt asked for a urinal the recovery room.  Given and helped the pt with it. Maw

## 2012-06-05 NOTE — Op Note (Signed)
[facility] [address] [place], [address]   COLONOSCOPY PROCEDURE REPORT  PATIENT: Adam Chambers, Adam Chambers  MR#: [id number] BIRTHDATE: 19-Jun-1941 , 71  yrs. old GENDER: Male ENDOSCOPIST: Iva Boop, MD, [facility]  PROCEDURE DATE:  06/05/2012 PROCEDURE:   Colonoscopy with biopsy ASA CLASS:   Class III INDICATIONS:average risk screening. MEDICATIONS: propofol (Diprivan) 300mg  IV, MAC sedation, administered by CRNA, and These medications were titrated to patient response per physician's verbal order  DESCRIPTION OF PROCEDURE:   After the risks benefits and alternatives of the procedure were thoroughly explained, informed consent was obtained.  A digital rectal exam revealed no abnormalities of the rectum and A digital rectal exam revealed the prostate was not enlarged.   The LB [id number]  endoscope was introduced through the anus and advanced to the cecum, which was identified by both the appendix and ileocecal valve. No adverse events experienced.   The quality of the prep was Suprep excellent The instrument was then slowly withdrawn as the colon was fully examined.      COLON FINDINGS: A sessile polyp measuring 3 mm in size was found in the transverse colon.  A polypectomy was performed with cold forceps.  The resection was complete and the polyp tissue was completely retrieved.   Small internal hemorrhoids were found. The colon mucosa was otherwise normal.  Retroflexed views revealed internal hemorrhoids. The time to cecum=3 minutes 03 seconds. Withdrawal time=13 minutes 06 seconds.  The scope was withdrawn and the procedure completed. COMPLICATIONS: There were no complications.  ENDOSCOPIC IMPRESSION: 1.   Sessile polyp measuring 3 mm in size was found in the transverse colon; polypectomy was performed with cold forceps 2.   Small internal hemorrhoids 3.   The colon mucosa was otherwise normal - excellent prep  RECOMMENDATIONS: await pathology  results   eSigned:  Iva Boop, MD, [facility] 06/05/2012 10:54 AM   cc: The Patient   and Sondra Come, MD

## 2012-06-06 ENCOUNTER — Telehealth: Payer: Self-pay

## 2012-06-06 NOTE — Telephone Encounter (Signed)
  Follow up Call-  Call back number 06/05/2012  Post procedure Call Back phone  # [phone]  Permission to leave phone message Yes     Patient questions:  Do you have a fever, pain , or abdominal swelling? no Pain Score  0 *  Have you tolerated food without any problems? yes  Have you been able to return to your normal activities? yes  Do you have any questions about your discharge instructions: Diet   no Medications  no Follow up visit  no  Do you have questions or concerns about your Care? no  Actions: * If pain score is 4 or above: No action needed, pain <4.

## 2012-06-12 ENCOUNTER — Encounter: Payer: Self-pay | Admitting: Internal Medicine

## 2012-06-12 NOTE — Progress Notes (Signed)
Quick Note:  3 mm adenoma He is 71 Recommend follow-up as needed ______

## 2012-08-03 ENCOUNTER — Other Ambulatory Visit: Payer: Medicare Other | Admitting: Lab

## 2012-11-01 ENCOUNTER — Other Ambulatory Visit: Payer: Medicare Other | Admitting: Lab

## 2012-11-01 DIAGNOSIS — E039 Hypothyroidism, unspecified: Secondary | ICD-10-CM

## 2012-11-01 DIAGNOSIS — C099 Malignant neoplasm of tonsil, unspecified: Secondary | ICD-10-CM

## 2012-11-01 DIAGNOSIS — G8929 Other chronic pain: Secondary | ICD-10-CM

## 2012-11-08 ENCOUNTER — Encounter: Payer: Self-pay | Admitting: Radiation Oncology

## 2012-11-08 ENCOUNTER — Ambulatory Visit
Admission: RE | Admit: 2012-11-08 | Discharge: 2012-11-08 | Disposition: A | Discharge status: Home / Self Care | Payer: Medicare Other | Source: Ambulatory Visit | Attending: Radiation Oncology | Admitting: Radiation Oncology

## 2012-11-08 VITALS — BP 167/94 | HR 48 | Temp 97.5°F | Resp 18 | Wt 199.5 lb

## 2012-11-08 DIAGNOSIS — C099 Malignant neoplasm of tonsil, unspecified: Secondary | ICD-10-CM

## 2012-11-08 MED ORDER — PILOCARPINE HCL 5 MG PO TABS: 5.0000 mg | ORAL_TABLET | Freq: Three times a day (TID) | ORAL | Status: DC

## 2012-11-08 NOTE — Progress Notes (Signed)
Radiation Oncology         [phone]) [phone] ________________________________  Name: Adam Chambers. 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 He continues followup with medical oncology and surgery. He is seen in the [place] system for his ENT exam.  Patient continues to have poor taste but this is helped with Mrs. Dash supplement.  He also has xerostomia which interferes with eating. He always water bottle around meals.  He would like to retry Salagen and I will refill this medication.  He denies any pain with swallowing or hemoptysis or breathing problems.  His TSH earlier this month was in normal range                            ALLERGIES:  is allergic to simvastatin.  Meds: Current Outpatient Prescriptions  Medication Sig Dispense Refill  . amLODipine (NORVASC) 5 MG tablet Take 10 mg by mouth daily.       Marland Kitchen aspirin 81 MG tablet Take 81 mg by mouth daily.        . benazepril (LOTENSIN) 20 MG tablet Take 20 mg by mouth daily.      . diazepam (VALIUM) 5 MG tablet Take 5 mg by mouth every 6 (six) hours as needed.        . doxazosin (CARDURA) 8 MG tablet Take 8 mg by mouth daily.        . flunisolide (NASAREL) 29 MCG/ACT (0.025%) nasal spray Place 2 sprays into the nose daily as needed. Dose is for each nostril.       Marland Kitchen levothyroxine (SYNTHROID, LEVOTHROID) 50 MCG tablet Take by mouth daily. Take 0.05mg  daily      . metoprolol (TOPROL-XL) 200 MG 24 hr tablet Take 200 mg by mouth daily. Take 1/2 tab daily      . oxyCODONE-acetaminophen (PERCOCET) 10-325 MG per tablet Take 1 tablet by mouth every  4 (four) hours as needed.      . senna (SENOKOT) 8.6 MG TABS Take 1 tablet by mouth daily as needed.        . vardenafil (LEVITRA) 20 MG tablet Take 20 mg by mouth daily as needed.         No current facility-administered medications for this encounter.    Physical Findings: The patient is in no acute distress. Patient is alert and oriented.  weight is 199 lb 8 oz (90.493 kg). His oral temperature is 97.5 F (36.4 C). His blood pressure is 167/94 and his pulse is 48. His respiration is 18. Marland Kitchen  No palpable supraclavicular or cervical adenopathy. The lungs are clear to auscultation. The heart has a regular rhythm and rate. Examination of oral cavity reveals mucosa to be moist. There are no mucosal lesions noted. A good view of the tonsil region was noted. There no signs of recurrence. Patient proceeded to undergo an indirect mirror examination. There are no mucosal lesions within the base of tongue vallecula or larynx area. The vocal cords moved well on exam.  Lab Findings: Lab Results  Component Value Date  WBC 3.9* 02/02/2012   HGB 14.5 02/02/2012   HCT 42.7 02/02/2012   MCV 84.4 02/02/2012   PLT 181 02/02/2012      Radiographic Findings: No results found.  Impression:  No evidence of recurrence on clinical exam today.  The patient will restart Salagen as above.  Plan:  Routine followup in 6 months.  _____________________________________  -----------------------------------  Billie Lade, PhD, MD

## 2012-11-08 NOTE — Progress Notes (Signed)
Patient presents to the clinic today for follow up with Dr. Roselind Messier. Patient is alert and oriented to person, place, and time. No distress noted. Steady gait noted. Pleasant affect noted. Patient reports continued arthritic pain. However, patient reports occasional throat pain and bilateral shoulder pain. Patient reports xerostomia continues to interfere with taste. Patient reports using biotene for dry mouth but, that it doesn't help. Patient reports he is still unable to eat meat unless it is pureed. Patient denies pain with swallowing. Patient reports that he continues to take his levothyroxine. Patient reports that he has a headache this morning. Patient's blood pressure elevated. Patient reports he take his bp medication at night. Reported all findings to Dr. Roselind Messier.

## 2013-01-29 ENCOUNTER — Telehealth: Payer: Self-pay | Admitting: Oncology

## 2013-01-29 ENCOUNTER — Ambulatory Visit (HOSPITAL_BASED_OUTPATIENT_CLINIC_OR_DEPARTMENT_OTHER): Payer: Medicare Other | Admitting: Oncology

## 2013-01-29 ENCOUNTER — Other Ambulatory Visit (HOSPITAL_BASED_OUTPATIENT_CLINIC_OR_DEPARTMENT_OTHER): Payer: Medicare Other | Admitting: Lab

## 2013-01-29 ENCOUNTER — Encounter: Payer: Self-pay | Admitting: Oncology

## 2013-01-29 ENCOUNTER — Other Ambulatory Visit: Payer: Self-pay

## 2013-01-29 ENCOUNTER — Other Ambulatory Visit: Payer: Self-pay | Admitting: Oncology

## 2013-01-29 VITALS — BP 145/79 | HR 48 | Temp 97.8°F | Resp 18 | Ht 76.0 in | Wt 196.4 lb

## 2013-01-29 DIAGNOSIS — E039 Hypothyroidism, unspecified: Secondary | ICD-10-CM

## 2013-01-29 DIAGNOSIS — G8929 Other chronic pain: Secondary | ICD-10-CM

## 2013-01-29 DIAGNOSIS — C099 Malignant neoplasm of tonsil, unspecified: Secondary | ICD-10-CM

## 2013-01-29 DIAGNOSIS — M549 Dorsalgia, unspecified: Secondary | ICD-10-CM

## 2013-01-29 LAB — CBC WITH DIFFERENTIAL/PLATELET
Basophils Absolute: 0 10*3/uL (ref 0.0–0.1)
EOS%: 2.6 % (ref 0.0–7.0)
Eosinophils Absolute: 0.1 10*3/uL (ref 0.0–0.5)
HCT: 43.6 % (ref 38.4–49.9)
HGB: 15.1 g/dL (ref 13.0–17.1)
MCH: 29 pg (ref 27.2–33.4)
MCV: 83.8 fL (ref 79.3–98.0)
MONO%: 10.4 % (ref 0.0–14.0)
NEUT#: 2.3 10*3/uL (ref 1.5–6.5)
NEUT%: 70.5 % (ref 39.0–75.0)
Platelets: 182 10*3/uL (ref 140–400)
RDW: 14.1 % (ref 11.0–14.6)

## 2013-01-29 LAB — COMPREHENSIVE METABOLIC PANEL (CC13)
Albumin: 3.7 g/dL (ref 3.5–5.0)
BUN: 17.9 mg/dL (ref 7.0–26.0)
CO2: 31 mEq/L — ABNORMAL HIGH (ref 22–29)
Glucose: 92 mg/dl (ref 70–99)
Sodium: 141 mEq/L (ref 136–145)
Total Bilirubin: 0.88 mg/dL (ref 0.20–1.20)
Total Protein: 6.8 g/dL (ref 6.4–8.3)

## 2013-01-29 MED ORDER — LEVOTHYROXINE SODIUM 75 MCG PO TABS: 75.0000 ug | ORAL_TABLET | Freq: Every day | ORAL | Status: DC

## 2013-01-29 NOTE — Telephone Encounter (Signed)
Gave pt appt for lab on December 2014 and  see MD next year june 2015

## 2013-01-29 NOTE — Progress Notes (Signed)
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 HPV status not determined, samples have not been released by the [place].   PAST THERAPY: He is status post one cycle of cisplatin back on 12/28/2009, however, due to acute renal insufficiency with other toxicity related to the cisplatin we changed his regimen to Taxol, carboplatin which he received on 02/02/2010 and 02/09/2010. He also had a course of radiation therapy from 12/28/2009 through 02/09/2010.   CURRENT THERAPY: watchful observation.   INTERVAL HISTORY: Adam Chambers. 72 y.o. male returns for regular follow up by himself.  He reports having some fatigue; however, he is independent of all activities of daily living. He is able to ambulate everyday about half an hour. He still has normal appetite and normal weight. His xerostomia is improved compared to before. He is able to eat all type of foods without restriction.  He has diffuse bone pain from history of DJD, OA and back surgeries. He reports bilateral neck stiffness however no discrete mass. He does not have dysphasia, odynophagia, nausea vomiting.  Patient denies headache, visual changes, confusion, drenching night sweats, palpable lymph node swelling, mucositis, odynophagia, dysphagia, nausea vomiting, jaundice, chest pain, palpitation, shortness of breath, dyspnea on exertion, productive cough, gum bleeding, epistaxis, hematemesis, hemoptysis, abdominal pain, abdominal swelling, early satiety, melena, hematochezia, hematuria, skin rash, spontaneous bleeding, joint swelling, heat or cold intolerance, bowel bladder incontinence, focal motor weakness, paresthesia, depression, suicidal or homocidal  ideation, feeling hopelessness.   Past Medical History  Diagnosis Date  . 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 Hx of radiation therapy 5/9/211-02/09/2010    rad tx tonsillar region   . History of chemotherapy 12/28/09 & then again 02/02/10 & 6/21/211    1 cycle cisplatin, then taxol & carboplatin  . Spinal stenosis     cervical and lumbar  . Blood transfusion   . CHF (congestive heart failure)   . Hepatitis 1995    food?  . BPH (benign prostatic hypertrophy)   . Chronic back pain   . Allergic rhinitis   . Otitis media     left eye  . Hypertension   . 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 Hypothyroid 02/02/2012  . Hypothyroid 02/02/2012  . Blurred vision, left eye     Past Surgical History  Procedure Laterality Date  . Cervical fusion    . Refractive surgery      left eye  . Gastrostomy tube placement      for nutrition /hydration , s/p mucositis assoc with odynophagia&dysphasia  . Tonsillectomy    . Back surgery      1992    Current Outpatient Prescriptions  Medication Sig Dispense Refill  . amLODipine (NORVASC) 5 MG tablet Take 10 mg by mouth daily.       Marland Kitchen aspirin 81 MG tablet Take 81 mg by mouth daily.        . benazepril (LOTENSIN) 20 MG tablet Take 20 mg by mouth daily.      . diazepam (VALIUM) 5 MG tablet Take 5 mg by mouth every 6 (six) hours as needed.        . doxazosin (CARDURA) 8 MG tablet Take  8 mg by mouth daily.        . flunisolide (NASAREL) 29 MCG/ACT (0.025%) nasal spray Place 2 sprays into the nose daily as needed. Dose is for each nostril.       Marland Kitchen levothyroxine (SYNTHROID, LEVOTHROID) 50 MCG tablet Take by mouth daily. Take 0.05mg  daily      . metoprolol (TOPROL-XL) 200 MG 24 hr tablet Take 200 mg by mouth daily. Take 1/2 tab daily      . oxyCODONE-acetaminophen (PERCOCET) 10-325 MG per tablet Take 1 tablet by mouth every 4 (four) hours as needed.      . pilocarpine (SALAGEN) 5 MG tablet Take 1  tablet (5 mg total) by mouth 3 (three) times daily.  90 tablet  6  . senna (SENOKOT) 8.6 MG TABS Take 1 tablet by mouth daily as needed.        . vardenafil (LEVITRA) 20 MG tablet Take 20 mg by mouth daily as needed.         No current facility-administered medications for this visit.    ALLERGIES:  is allergic to simvastatin.  REVIEW OF SYSTEMS:  The rest of the 14-point review of system was negative.   Filed Vitals:   01/29/13 1036  BP: 145/79  Pulse: 48  Temp: 97.8 F (36.6 C)  Resp: 18   Wt Readings from Last 3 Encounters:  01/29/13 196 lb 6.4 oz (89.086 kg)  11/08/12 199 lb 8 oz (90.493 kg)  06/05/12 191 lb (86.637 kg)   ECOG Performance status: 1  PHYSICAL EXAMINATION:  General: well-nourished man in no acute distress. Eyes: no scleral icterus. ENT: There were no oropharyngeal lesions on my unaided exam. Neck was without thyromegaly. Lymphatics: Negative cervical, supraclavicular or axillary adenopathy. Respiratory: lungs were clear bilaterally without wheezing or crackles. Cardiovascular: Regular rate and rhythm, S1/S2, without murmur, rub or gallop. There was no pedal edema. GI: abdomen was soft, flat, nontender, nondistended, without organomegaly. Muscoloskeletal: no spinal tenderness of palpation of vertebral spine. Skin exam was without echymosis, petichae. Neuro exam was nonfocal. Patient was able to get on and off exam table without assistance. Gait was normal. Patient was alerted and oriented. Attention was good. Language was appropriate. Mood was normal without depression. Speech was not pressured. Thought content was not tangential.    LABORATORY/RADIOLOGY DATA:  Lab Results  Component Value Date   WBC 3.2* 01/29/2013   HGB 15.1 01/29/2013   HCT 43.6 01/29/2013   PLT 182 01/29/2013   GLUCOSE 92 01/29/2013   ALKPHOS 52 01/29/2013   ALT 10 01/29/2013   AST 13 01/29/2013   NA 141 01/29/2013   K 4.5 01/29/2013   CL 104 01/29/2013   CREATININE 1.4* 01/29/2013   BUN 17.9  01/29/2013   CO2 31* 01/29/2013   INR 1.11 SLIGHT HEMOLYSIS 01/20/2010    ASSESSMENT AND PLAN:   1. History of oropharyngeal squamous cell carcinoma: I discussed with Adam Chambers that he has to evidence of disease recurrence or metastases on clinical history, physical exam, laboratory tests and PET scan.  As he is 3 years out from the finish of therapy there is no indications for routine surveillance scan unless he has any concerning symptoms.  He has bilateral neck discomfort however could not find any mass. He was seen by Dr. Roselind Messier earlier last month and didn't see any finding on examination. He is seeing a new ENT physician at the [place] with the next few months and I advised him to have an ENT examination  of his oropharynx ensure no recurrent disease. 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  I advised him to the next scheduled exercise as needed.  2. 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 He is on levothyroxine. His TSH is pending today. 3. Hypertension. He is on amlodipine per PCP. 4. Benign prostatic hypertrophy: He is on Doxil Zosyn per PCP. 5. Risk reduction: He does not drink alcohol, smoke cigarettes, chew tobacco. 6. Diffuse osteoarthritis and bone pain: This predated the cancer diagnosis. He has requested a refill of his pain medication today. I have deferred this refill to his PCP as it is not related to his cancer diagnosis.   7. Followup: Follow-up with medical oncology at the cancer Center in about one year. I advised him to also followup with ENT and radiation oncology in between visits with [place].

## 2013-01-29 NOTE — Patient Instructions (Signed)
1. History of tonsil cancer: In remission. After 2 years from finish of radiation and chemotherapy, there is no indication to repeat routine CT scan unless there are symptoms. Continue to follow up with Ears/Nose/Throat physician for at least once or twice annual examinations. 2. Please continue to refrain from smoking, chewing tobacco, drinking alcohol which can increase the risk of recurrence tonsil cancer. 3. Hypothyroidism: Continue with Synthroid. 4. Followup in one year (but sooner if concerning symptoms).

## 2013-01-30 ENCOUNTER — Telehealth: Payer: Self-pay | Admitting: Oncology

## 2013-01-30 NOTE — Telephone Encounter (Signed)
s,w, pt and advised on Sept lab...pt ok and aware

## 2013-04-09 ENCOUNTER — Other Ambulatory Visit: Payer: Self-pay | Admitting: Neurosurgery

## 2013-04-09 DIAGNOSIS — M545 Low back pain: Secondary | ICD-10-CM

## 2013-04-10 ENCOUNTER — Ambulatory Visit
Admission: RE | Admit: 2013-04-10 | Discharge: 2013-04-10 | Disposition: A | Discharge status: Home / Self Care | Payer: Medicare Other | Source: Ambulatory Visit | Attending: Neurosurgery | Admitting: Neurosurgery

## 2013-04-10 VITALS — BP 213/106 | HR 52

## 2013-04-10 DIAGNOSIS — M545 Low back pain: Secondary | ICD-10-CM

## 2013-04-10 MED ORDER — METHYLPREDNISOLONE ACETATE 40 MG/ML INJ SUSP (RADIOLOG
120.0000 mg | Freq: Once | INTRAMUSCULAR | Status: AC
  Administered 2013-04-10: 120 mg via EPIDURAL

## 2013-04-10 MED ORDER — IOHEXOL 180 MG/ML  SOLN
1.0000 mL | Freq: Once | INTRAMUSCULAR | Status: AC | PRN
  Administered 2013-04-10: 1 mL via EPIDURAL

## 2013-04-25 ENCOUNTER — Ambulatory Visit: Payer: Medicare Other | Admitting: Radiation Oncology

## 2013-05-02 ENCOUNTER — Other Ambulatory Visit: Payer: Medicare Other

## 2013-05-09 ENCOUNTER — Other Ambulatory Visit (HOSPITAL_BASED_OUTPATIENT_CLINIC_OR_DEPARTMENT_OTHER): Payer: Medicare Other

## 2013-05-09 DIAGNOSIS — E039 Hypothyroidism, unspecified: Secondary | ICD-10-CM

## 2013-05-10 ENCOUNTER — Telehealth: Payer: Self-pay | Admitting: *Deleted

## 2013-05-10 NOTE — Telephone Encounter (Signed)
Informed pt of TSH wnl and to continue current dose of synthroid 75 mcg daily.  Keep next lab appt as scheduled in December.  Pt verbalized understanding.

## 2013-05-10 NOTE — Telephone Encounter (Signed)
Message copied by Wende Mott on Fri May 10, 2013  9:44 AM ------      Message from: Adam Chambers      Created: Thu May 09, 2013  8:39 PM       Call pt. TSH is normal. Continue current dose of Synthroid. ------

## 2013-05-23 ENCOUNTER — Ambulatory Visit
Admission: RE | Admit: 2013-05-23 | Discharge: 2013-05-23 | Disposition: A | Discharge status: Home / Self Care | Payer: Medicare Other | Source: Ambulatory Visit | Attending: Radiation Oncology | Admitting: Radiation Oncology

## 2013-05-23 ENCOUNTER — Encounter: Payer: Self-pay | Admitting: Radiation Oncology

## 2013-05-23 VITALS — BP 166/83 | HR 46 | Temp 98.5°F | Ht 76.0 in | Wt 191.6 lb

## 2013-05-23 DIAGNOSIS — C099 Malignant neoplasm of tonsil, unspecified: Secondary | ICD-10-CM

## 2013-05-23 NOTE — Progress Notes (Signed)
Adam Chambers here for follow up after treatment to his right tonsil.  He is having pain in his left leg that he is rating at a 10/10.  He says it started about a month ago and thinks it is related to his back issues.  He reports a dry mouth and takes salagin occasionally.  He is able to eat everything except meats which he needs ground up.  He also says food feels like it gets stuck in his throat.  He is wondering if he can have esophageal stretching.  He is worried about eating at home by himself in case he chokes.  His oral mucosa is intact.  His heart rate today was 46 and bp was 166/83 .  He says he walks 3-4 miles per day and is not dizzy.

## 2013-05-23 NOTE — Progress Notes (Signed)
Radiation Oncology         [phone]) [phone] ________________________________  Name: Adam Chambers. MRN: [id number]  Date: 05/23/2013  DOB: 12-Jul-1941  Follow-Up Visit Note  CC: Sondra Come, MD  Exie Parody, MD  Diagnosis:   Tonsillar carcinoma  Interval Since Last Radiation:  3 years and 4 months   Narrative:  The patient returns today for routine follow-up.  He is doing reasonably well except for problems with a choking sensation with meats. He is concerned that maybe his throat is narrowed. He denies any breathing problems. He continues to followup with medical oncology and is scheduled to see ENT at the [facility] hospital later this fall. He denies any blood-tinged sputum cough or breathing problems. He continues on Synthroid 75 mcg per day. He walks 3-4 miles per day for exercise.                              ALLERGIES:  is allergic to simvastatin.  Meds: Current Outpatient Prescriptions  Medication Sig Dispense Refill  . amLODipine (NORVASC) 5 MG tablet Take 10 mg by mouth daily.       Marland Kitchen aspirin 81 MG tablet Take 81 mg by mouth daily.        . diazepam (VALIUM) 5 MG tablet Take 5 mg by mouth every 6 (six) hours as needed.        . doxazosin (CARDURA) 8 MG tablet Take 8 mg by mouth daily.        . flunisolide (NASAREL) 29 MCG/ACT (0.025%) nasal spray Place 2 sprays into the nose daily as needed. Dose is for each nostril.       Marland Kitchen levothyroxine (SYNTHROID) 75 MCG tablet Take 1 tablet (75 mcg total) by mouth daily before breakfast.  30 tablet  3  . metoprolol (TOPROL-XL) 200 MG 24 hr tablet Take 200 mg by mouth daily. Take 1/2 tab daily      . oxyCODONE-acetaminophen (PERCOCET) 10-325 MG per tablet Take 1 tablet by mouth every 4 (four) hours as needed.      . pilocarpine (SALAGEN) 5 MG tablet Take 1 tablet (5 mg total) by mouth 3 (three) times daily.  90 tablet  6  . senna (SENOKOT) 8.6 MG TABS Take 1 tablet by mouth daily as needed.        . sildenafil (VIAGRA) 100 MG tablet Take 100 mg by  mouth daily as needed for erectile dysfunction. Takes 1/2 tablet      . benazepril (LOTENSIN) 20 MG tablet Take 20 mg by mouth daily.       No current facility-administered medications for this encounter.    Physical Findings: The patient is in no acute distress. Patient is alert and oriented.  height is 6\' 4"  (1.93 m) and weight is 191 lb 9.6 oz (86.909 kg). His temperature is 98.5 F (36.9 C). His blood pressure is 166/83 and his pulse is 46. His oxygen saturation is 100%. . No palpable cervical supraclavicular or axillary adenopathy. The lungs are clear to auscultation. The heart has a regular rhythm and rate. Examination of oral cavity reveals somewhat thickened saliva.  There are no mucosal lesions noted. The patient proceeded to undergo an indirect mirror examination. The vocal cords moved well on exam.   I was unable to see the entire extent of the vocal cords. There were no mucosal lesions noted in the base of tongue area. Palpation along the base of tongue  and tonsillar region reveals no suspicious induration.  Lab Findings: Lab Results  Component Value Date   WBC 3.2* 01/29/2013   HGB 15.1 01/29/2013   HCT 43.6 01/29/2013   MCV 83.8 01/29/2013   PLT 182 01/29/2013     Radiographic Findings: No results found.  Impression:  No evidence of recurrence on clinical exam today  Plan:  Routine followup in 6 months.  The patient will be scheduled for a barium swallow to see if there is any issues concerning his swallowing mechanism or narrowing.  _____________________________________  -----------------------------------  Billie Lade, PhD, MD

## 2013-06-25 ENCOUNTER — Other Ambulatory Visit: Payer: Self-pay | Admitting: Neurosurgery

## 2013-06-25 DIAGNOSIS — M5416 Radiculopathy, lumbar region: Secondary | ICD-10-CM

## 2013-07-02 ENCOUNTER — Inpatient Hospital Stay
Admission: RE | Admit: 2013-07-02 | Discharge: 2013-07-02 | Disposition: A | Discharge status: Home / Self Care | Payer: Self-pay | Source: Ambulatory Visit | Attending: Neurosurgery | Admitting: Neurosurgery

## 2013-07-02 ENCOUNTER — Other Ambulatory Visit: Payer: Medicare Other

## 2013-07-02 ENCOUNTER — Ambulatory Visit
Admission: RE | Admit: 2013-07-02 | Discharge: 2013-07-02 | Disposition: A | Discharge status: Home / Self Care | Payer: Medicare Other | Source: Ambulatory Visit | Attending: Neurosurgery | Admitting: Neurosurgery

## 2013-07-02 ENCOUNTER — Other Ambulatory Visit: Payer: Self-pay | Admitting: Neurosurgery

## 2013-07-02 VITALS — BP 146/77 | HR 40

## 2013-07-02 DIAGNOSIS — M5416 Radiculopathy, lumbar region: Secondary | ICD-10-CM

## 2013-07-02 DIAGNOSIS — G8929 Other chronic pain: Secondary | ICD-10-CM

## 2013-07-02 MED ORDER — IOHEXOL 180 MG/ML  SOLN
15.0000 mL | Freq: Once | INTRAMUSCULAR | Status: AC | PRN
  Administered 2013-07-02: 15 mL via INTRATHECAL

## 2013-07-02 MED ORDER — MEPERIDINE HCL 100 MG/ML IJ SOLN
100.0000 mg | Freq: Once | INTRAMUSCULAR | Status: AC
  Administered 2013-07-02: 100 mg via INTRAMUSCULAR

## 2013-07-02 MED ORDER — DIAZEPAM 5 MG PO TABS
5.0000 mg | ORAL_TABLET | Freq: Once | ORAL | Status: AC
  Administered 2013-07-02: 5 mg via ORAL

## 2013-07-02 MED ORDER — ONDANSETRON HCL 4 MG/2ML IJ SOLN
4.0000 mg | Freq: Once | INTRAMUSCULAR | Status: AC
  Administered 2013-07-02: 4 mg via INTRAMUSCULAR

## 2013-07-02 NOTE — Progress Notes (Signed)
Wife at bedside.  Patient resting quietly supine on stretcher after myelogram.

## 2013-08-01 ENCOUNTER — Other Ambulatory Visit: Payer: Medicare Other

## 2013-08-02 ENCOUNTER — Other Ambulatory Visit (HOSPITAL_BASED_OUTPATIENT_CLINIC_OR_DEPARTMENT_OTHER): Payer: Medicare Other

## 2013-08-02 DIAGNOSIS — E039 Hypothyroidism, unspecified: Secondary | ICD-10-CM

## 2013-11-18 ENCOUNTER — Telehealth: Payer: Self-pay | Admitting: *Deleted

## 2013-11-18 DIAGNOSIS — E039 Hypothyroidism, unspecified: Secondary | ICD-10-CM

## 2013-11-18 NOTE — Telephone Encounter (Signed)
Pt requests refill on his Synthroid 75 mcg daily, he has 3 pills left.  Former pt of Dr. Lamonte Sakai,  He is scheduled to see Dr. Alvy Bimler in June.  His Rad Onc is Dr. Sondra Come and his ENT is Dr. Henrene Pastor at the [facility]

## 2013-11-19 MED ORDER — LEVOTHYROXINE SODIUM 75 MCG PO TABS: 75.0000 ug | ORAL_TABLET | Freq: Every day | ORAL | Status: DC

## 2013-11-19 NOTE — Telephone Encounter (Signed)
Informed pt of refill for Synthroid sent electronically to his pharmacy.  He verbalized understanding.

## 2013-11-28 ENCOUNTER — Ambulatory Visit
Admission: RE | Admit: 2013-11-28 | Discharge: 2013-11-28 | Disposition: A | Discharge status: Home / Self Care | Payer: Medicare Other | Source: Ambulatory Visit | Attending: Radiation Oncology | Admitting: Radiation Oncology

## 2013-11-28 ENCOUNTER — Encounter: Payer: Self-pay | Admitting: Radiation Oncology

## 2013-11-28 VITALS — BP 146/90 | HR 51 | Temp 97.8°F | Ht 76.0 in | Wt 200.8 lb

## 2013-11-28 DIAGNOSIS — C099 Malignant neoplasm of tonsil, unspecified: Secondary | ICD-10-CM

## 2013-11-28 NOTE — Progress Notes (Signed)
Adam Chambers here for follow up after treatment for tonsillar carcinoma.  He reports pain due to arthritis in his shoulders.  He takes percocet as needed for this.  He also reports a sore throat and having a lot of phlegm in his throat in the mornings.  He reports a good appetitie and eats soft foods.  He has trouble swallowing meats.  His weight is up 9 lbs since his last visit.  He reports a dry mouth and thick saliva.  He takes salagen as needed.  His oral mucosa is intact.  He reports getting tired during the day.

## 2013-11-28 NOTE — Progress Notes (Signed)
Radiation Oncology         [phone]) [phone] ________________________________  Name: Adam Chambers. MRN: [id number]  Date: 11/28/2013  DOB: Feb 28, 1941  Follow-Up Visit Note  CC: Windy Fast, MD  Nobie Putnam, MD  Diagnosis:   Tonsillar cancer  Interval Since Last Radiation:  3 years and 10 months   Narrative:  The patient returns today for routine follow-up.  He seems to be doing recently well. He continues to have discomfort in her shoulders from arthritis. He does have mild sore throat upon awakening and some phlegm production early in the morning but no blood. Patient has trouble swallowing meats but if chewed well he can meats. The patient did undergo a barium swallow since my last followup. This was performed at the [facility] According to patient there is no narrowing noted or swallowing abnormalities. He denies any pain with swallowing this time or ear pain. Thyroid function studies are performed at the [place]:  is allergic to simvastatin.  Meds: Current Outpatient Prescriptions  Medication Sig Dispense Refill  . amLODipine (NORVASC) 5 MG tablet Take 10 mg by mouth daily.       Marland Kitchen aspirin 81 MG tablet Take 81 mg by mouth daily.        . benazepril (LOTENSIN) 20 MG tablet Take 20 mg by mouth daily.      . diazepam (VALIUM) 5 MG tablet Take 5 mg by mouth every 6 (six) hours as needed.        . doxazosin (CARDURA) 8 MG tablet Take 8 mg by mouth daily.        . flunisolide (NASAREL) 29 MCG/ACT (0.025%) nasal spray Place 2 sprays into the nose daily as needed. Dose is for each nostril.       Marland Kitchen levothyroxine (SYNTHROID) 75 MCG tablet Take 1 tablet (75 mcg total) by mouth daily before breakfast.  90 tablet  3  . metoprolol (TOPROL-XL) 200 MG 24 hr tablet Take 200 mg by mouth daily. Take 1/2 tab daily      . oxyCODONE-acetaminophen (PERCOCET) 10-325 MG per tablet Take 1 tablet by mouth every 4 (four) hours as needed.      . pilocarpine (SALAGEN)  5 MG tablet Take 1 tablet (5 mg total) by mouth 3 (three) times daily.  90 tablet  6  . senna (SENOKOT) 8.6 MG TABS Take 1 tablet by mouth daily as needed.        . sildenafil (VIAGRA) 100 MG tablet Take 100 mg by mouth daily as needed for erectile dysfunction. Takes 1/2 tablet       No current facility-administered medications for this encounter.    Physical Findings: The patient is in no acute distress. Patient is alert and oriented.  height is 6\' 4"  (1.93 m) and weight is 200 lb 12.8 oz (91.082 kg). His temperature is 97.8 F (36.6 C). His blood pressure is 146/90 and his pulse is 51. His oxygen saturation is 99%. .  No palpable cervical supraclavicular or axillary adenopathy. The lungs are clear to auscultation. The heart has a regular rhythm and rate. Examination of the oral cavity reveals mild xerostomia. No mucosal lesions are noted in the oral cavity. Mild radiation changes are noted along the tonsillar region. No visible signs recurrence. Palpation along the base of tongue and tonsillar  region reveals no suspicious induration.  Indirect laryngoscopy  Patient proceeded to undergo indirect mirror examination through the oral cavity. A good view of the base of tongue and vallecula as well as of glottis were noted. No mucosal lesions noted. A partial view of the vocal cords revealed them to move well without paralysis.  Lab Findings: Lab Results  Component Value Date   WBC 3.2* 01/29/2013   HGB 15.1 01/29/2013   HCT 43.6 01/29/2013   MCV 83.8 01/29/2013   PLT 182 01/29/2013      Radiographic Findings: No results found.  Impression:  No evidence of recurrence on clinical exam today  Plan:  Routine followup in 6 months.  ____________________________________ Blair Promise, MD

## 2013-12-08 ENCOUNTER — Emergency Department (HOSPITAL_COMMUNITY)
Admission: EM | Admit: 2013-12-08 | Discharge: 2013-12-08 | Disposition: A | Discharge status: Home / Self Care | Payer: Medicare Other | Source: Home / Self Care

## 2013-12-08 ENCOUNTER — Encounter (HOSPITAL_COMMUNITY): Payer: Self-pay | Admitting: Emergency Medicine

## 2013-12-08 ENCOUNTER — Ambulatory Visit (HOSPITAL_COMMUNITY): Payer: Medicare Other | Attending: Emergency Medicine

## 2013-12-08 ENCOUNTER — Emergency Department (HOSPITAL_COMMUNITY): Payer: Medicare Other

## 2013-12-08 DIAGNOSIS — W19XXXA Unspecified fall, initial encounter: Secondary | ICD-10-CM | POA: Insufficient documentation

## 2013-12-08 DIAGNOSIS — S62629A Displaced fracture of medial phalanx of unspecified finger, initial encounter for closed fracture: Secondary | ICD-10-CM

## 2013-12-08 DIAGNOSIS — IMO0002 Reserved for concepts with insufficient information to code with codable children: Secondary | ICD-10-CM

## 2013-12-08 MED ORDER — OXYCODONE-ACETAMINOPHEN 5-325 MG PO TABS: 1.0000 | ORAL_TABLET | Freq: Four times a day (QID) | ORAL | Status: DC | PRN

## 2013-12-08 NOTE — Discharge Instructions (Signed)
Cast or Splint Care Casts and splints support injured limbs and keep bones from moving while they heal. It is important to care for your cast or splint at home.  HOME CARE INSTRUCTIONS  Keep the cast or splint uncovered during the drying period. It can take 24 to 48 hours to dry if it is made of plaster. A fiberglass cast will dry in less than 1 hour.  Do not rest the cast on anything harder than a pillow for the first 24 hours.  Do not put weight on your injured limb or apply pressure to the cast until your health care provider gives you permission.  Keep the cast or splint dry. Wet casts or splints can lose their shape and may not support the limb as well. A wet cast that has lost its shape can also create harmful pressure on your skin when it dries. Also, wet skin can become infected.  Cover the cast or splint with a plastic bag when bathing or when out in the rain or snow. If the cast is on the trunk of the body, take sponge baths until the cast is removed.  If your cast does become wet, dry it with a towel or a blow dryer on the cool setting only.  Keep your cast or splint clean. Soiled casts may be wiped with a moistened cloth.  Do not place any hard or soft foreign objects under your cast or splint, such as cotton, toilet paper, lotion, or powder.  Do not try to scratch the skin under the cast with any object. The object could get stuck inside the cast. Also, scratching could lead to an infection. If itching is a problem, use a blow dryer on a cool setting to relieve discomfort.  Do not trim or cut your cast or remove padding from inside of it.  Exercise all joints next to the injury that are not immobilized by the cast or splint. For example, if you have a long leg cast, exercise the hip joint and toes. If you have an arm cast or splint, exercise the shoulder, elbow, thumb, and fingers.  Elevate your injured arm or leg on 1 or 2 pillows for the first 1 to 3 days to decrease  swelling and pain.It is best if you can comfortably elevate your cast so it is higher than your heart. SEEK MEDICAL CARE IF:   Your cast or splint cracks.  Your cast or splint is too tight or too loose.  You have unbearable itching inside the cast.  Your cast becomes wet or develops a soft spot or area.  You have a bad smell coming from inside your cast.  You get an object stuck under your cast.  Your skin around the cast becomes red or raw.  You have new pain or worsening pain after the cast has been applied. SEEK IMMEDIATE MEDICAL CARE IF:   You have fluid leaking through the cast.  You are unable to move your fingers or toes.  You have discolored (blue or white), cool, painful, or very swollen fingers or toes beyond the cast.  You have tingling or numbness around the injured area.  You have severe pain or pressure under the cast.  You have any difficulty with your breathing or have shortness of breath.  You have chest pain. Document Released: 08/05/2000 Document Revised: 05/29/2013 Document Reviewed: 02/14/2013 [facility] Patient Information 2014 [place].  Finger Fracture Fractures of fingers are breaks in the bones of the fingers. There  are many types of fractures. There are different ways of treating these fractures. Your health care provider will discuss the best way to treat your fracture. CAUSES Traumatic injury is the main cause of broken fingers. These include:  Injuries while playing sports.  Workplace injuries.  Falls. RISK FACTORS Activities that can increase your risk of finger fractures include:  Sports.  Workplace activities that involve machinery.  A condition called osteoporosis, which can make your bones less dense and cause them to fracture more easily. SIGNS AND SYMPTOMS The main symptoms of a broken finger are pain and swelling within 15 minutes after the injury. Other symptoms include:  Bruising of your finger.  Stiffness of  your finger.  Numbness of your finger.  Exposed bones (compound fracture) if the fracture is severe. DIAGNOSIS  The best way to diagnose a broken bone is with X-ray imaging. Additionally, your health care provider will use this X-ray image to evaluate the position of the broken finger bones.  TREATMENT  Finger fractures can be treated with:   Nonreduction This means the bones are in place. The finger is splinted without changing the positions of the bone pieces. The splint is usually left on for about a week to 10 days. This will depend on your fracture and what your health care provider thinks.  Closed reduction The bones are put back into position without using surgery. The finger is then splinted.  Open reduction and internal fixation The fracture site is opened. Then the bone pieces are fixed into place with pins or some type of hardware. This is seldom required. It depends on the severity of the fracture. HOME CARE INSTRUCTIONS   Follow your health care provider's instructions regarding activities, exercises, and physical therapy.  Only take over-the-counter or prescription medicines for pain, discomfort, or fever as directed by your health care provider. SEEK MEDICAL CARE IF: You have pain or swelling that limits the motion or use of your fingers. SEEK IMMEDIATE MEDICAL CARE IF:  Your finger becomes numb. MAKE SURE YOU:   Understand these instructions.  Will watch your condition.  Will get help right away if you are not doing well or get worse. Document Released: 11/20/2000 Document Revised: 05/29/2013 Document Reviewed: 03/20/2013 [facility] Patient Information 2014 [place], [place].  Finger Fracture A finger fracture is when one or more bones in the finger break.  HOME CARE   Wear the splint, tape, or cast as long as told by your doctor.  Keep your fingers in the position your doctor tell you to.  Raise (elevate) the injured area above the level of the heart.  Only  take medicine as told by your doctor.  Put ice on the injured area.  Put ice in a plastic bag.  Place a towel between the skin and the bag.  Leave the ice on for 15-20 minutes, 03-04 times a day.  Follow up with your doctor.  Ask what exercises you can do when the splint comes off. GET HELP RIGHT AWAY IF:   The fingernails are white or bluish.  You have pain not helped by medicine.  You cannot move your fingertips.  You lose feeling (numbness) in the injured finger(s). MAKE SURE YOU:   Understand these instructions.  Will watch this condition.  Will get help right away if you are not doing well or get worse. Document Released: 01/25/2008 Document Revised: 10/31/2011 Document Reviewed: 01/25/2008 [facility] Patient Information 2014 [place], [place].

## 2013-12-08 NOTE — ED Notes (Signed)
Reports falling this morning @ 0900, jamming right middle finger.  Swelling noted to finger with pain radiating up into hand.  Ecchymosis noted at PIP joint.  Has applied ice.

## 2013-12-08 NOTE — ED Provider Notes (Signed)
CSN: [id number]     Arrival date & time 12/08/13  1750 History   First MD Initiated Contact with Patient 12/08/13 Adam Chambers     Chief Complaint  Patient presents with  . Finger Injury   (Consider location/radiation/quality/duration/timing/severity/associated sxs/prior Treatment) HPI Comments: [place] backwards onto bot hands this AM. Has pain and swelling to the R long finger. Denies other injury.   Past Medical History  Diagnosis Date  . 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 Hx of radiation therapy 5/9/211-02/09/2010    rad tx tonsillar region   . History of chemotherapy 12/28/09 & then again 02/02/10 & 6/21/211    1 cycle cisplatin, then taxol & carboplatin  . Spinal stenosis     cervical and lumbar  . Blood transfusion   . CHF (congestive heart failure)   . Hepatitis 1995    food?  . BPH (benign prostatic hypertrophy)   . Chronic back pain   . Allergic rhinitis   . Otitis media     left eye  . Hypertension   . 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 Hypothyroid 02/02/2012  . Hypothyroid 02/02/2012  . Blurred vision, left eye    Past Surgical History  Procedure Laterality Date  . Cervical fusion    . Refractive surgery      left eye  . Gastrostomy tube placement      for nutrition /hydration , s/p mucositis assoc with odynophagia&dysphasia  . Tonsillectomy    . Back surgery      1992   Family History  Problem Relation Age of Onset  . Lung cancer Sister   . Diabetes Brother    History  Substance Use Topics  . Smoking status: Former Smoker -- 2.00 packs/day for 34 years    Types: Cigarettes    Quit date: 06/30/1993  . Smokeless tobacco: Never Used  . Alcohol Use: No    Review of Systems  Constitutional: Negative.   Respiratory: Negative.   Gastrointestinal: Negative.   Genitourinary: Negative.   Musculoskeletal: Negative for back pain.       As per HPI  Skin: Negative.   Neurological: Negative for dizziness,  weakness, numbness and headaches.    Allergies  Simvastatin  Home Medications   Prior to Admission medications   Medication Sig Start Date End Date Taking? Authorizing Provider  amLODipine (NORVASC) 5 MG tablet Take 10 mg by mouth daily.    Yes Historical Provider, MD  benazepril (LOTENSIN) 20 MG tablet Take 20 mg by mouth daily.   Yes Historical Provider, MD  doxazosin (CARDURA) 8 MG tablet Take 8 mg by mouth daily.     Yes Historical Provider, MD  levothyroxine (SYNTHROID) 75 MCG tablet Take 1 tablet (75 mcg total) by mouth daily before breakfast. 11/19/13  Yes Heath Lark, MD  metoprolol (TOPROL-XL) 200 MG 24 hr tablet Take 200 mg by mouth daily. Take 1/2 tab daily   Yes Historical Provider, MD  aspirin 81 MG tablet Take 81 mg by mouth daily.      Historical Provider, MD  diazepam (VALIUM) 5 MG tablet Take 5 mg by mouth every 6 (six) hours as needed.      Historical Provider, MD  flunisolide (NASAREL) 29 MCG/ACT (0.025%) nasal spray Place 2 sprays into the nose daily as needed. Dose is for each nostril.     Historical Provider, MD  oxyCODONE-acetaminophen (PERCOCET) 10-325 MG per tablet Take 1  tablet by mouth every 4 (four) hours as needed.    Historical Provider, MD  pilocarpine (SALAGEN) 5 MG tablet Take 1 tablet (5 mg total) by mouth 3 (three) times daily. 11/08/12   Blair Promise, MD  senna (SENOKOT) 8.6 MG TABS Take 1 tablet by mouth daily as needed.      Historical Provider, MD  sildenafil (VIAGRA) 100 MG tablet Take 100 mg by mouth daily as needed for erectile dysfunction. Takes 1/2 tablet    Historical Provider, MD   BP 167/85  Pulse 55  Temp(Src) 98.7 F (37.1 C) (Oral)  Resp 16  SpO2 100% Physical Exam  Nursing note and vitals reviewed. Constitutional: He is oriented to person, place, and time. He appears well-developed and well-nourished.  HENT:  Head: Normocephalic and atraumatic.  Eyes: EOM are normal. Left eye exhibits no discharge.  Neck: Normal range of motion.  Neck supple.  Pulmonary/Chest: Effort normal. No respiratory distress.  Musculoskeletal:  Swelling and tenderness Right long finger. Greatest at the PIP. Extension nl. St too painful; to attempt flexion. Cap refill brisk. Nl sensory.  Neurological: He is alert and oriented to person, place, and time. No cranial nerve deficit.  Skin: Skin is warm and dry.  Psychiatric: He has a normal mood and affect.    ED Course  Procedures (including critical care time) Labs Review Labs Reviewed - No data to display  Results for orders placed in visit on 08/02/13  TSH [facility]      Result Value Ref Range   TSH 1.087  0.320 - 4.118 m(IU)/L   Imaging Review Dg Hand Complete Right  12/08/2013   CLINICAL DATA:  Fall.  Hand injury.  Swelling.  EXAM: RIGHT HAND - COMPLETE 3+ VIEW  COMPARISON:  None.  FINDINGS: Osteoarthritis noted. Volar base plate avulsion fracture of the middle phalanx of the middle finger. No additional fracture identified.  IMPRESSION: 1. Acute volar base plate avulsion fracture of the middle phalanx, middle finger. 2. Osteoarthritis.   Electronically Signed   By: Sherryl Barters M.D.   On: 12/08/2013 18:50     MDM   1. Fracture of finger, middle phalanx, right, closed    Splint R long finger in POF RICE Percocet for pain Call ortho/hand tomorrow for appointment.     Janne Napoleon, NP 12/08/13 [place], NP 12/08/13 [phone]

## 2013-12-09 NOTE — ED Provider Notes (Signed)
Medical screening examination/treatment/procedure(s) were performed by non-physician practitioner and as supervising physician I was immediately available for consultation/collaboration.  Philipp Deputy, M.D.  Harden Mo, MD 12/09/13 1106

## 2014-01-30 ENCOUNTER — Other Ambulatory Visit: Payer: Self-pay | Admitting: Hematology and Oncology

## 2014-01-30 ENCOUNTER — Other Ambulatory Visit (HOSPITAL_BASED_OUTPATIENT_CLINIC_OR_DEPARTMENT_OTHER): Payer: Medicare Other

## 2014-01-30 ENCOUNTER — Encounter: Payer: Self-pay | Admitting: Hematology and Oncology

## 2014-01-30 ENCOUNTER — Ambulatory Visit (HOSPITAL_BASED_OUTPATIENT_CLINIC_OR_DEPARTMENT_OTHER): Payer: Medicare Other | Admitting: Hematology and Oncology

## 2014-01-30 VITALS — BP 159/87 | HR 45 | Temp 97.9°F | Resp 20 | Ht 76.0 in | Wt 196.1 lb

## 2014-01-30 DIAGNOSIS — R682 Dry mouth, unspecified: Secondary | ICD-10-CM

## 2014-01-30 DIAGNOSIS — K117 Disturbances of salivary secretion: Secondary | ICD-10-CM | POA: Insufficient documentation

## 2014-01-30 DIAGNOSIS — C099 Malignant neoplasm of tonsil, unspecified: Secondary | ICD-10-CM

## 2014-01-30 DIAGNOSIS — R7989 Other specified abnormal findings of blood chemistry: Secondary | ICD-10-CM | POA: Insufficient documentation

## 2014-01-30 DIAGNOSIS — R1310 Dysphagia, unspecified: Secondary | ICD-10-CM | POA: Insufficient documentation

## 2014-01-30 DIAGNOSIS — E039 Hypothyroidism, unspecified: Secondary | ICD-10-CM

## 2014-01-30 DIAGNOSIS — R944 Abnormal results of kidney function studies: Secondary | ICD-10-CM

## 2014-01-30 LAB — COMPREHENSIVE METABOLIC PANEL (CC13)
ALBUMIN: 3.9 g/dL (ref 3.5–5.0)
ALT: 11 U/L (ref 0–55)
AST: 15 U/L (ref 5–34)
Alkaline Phosphatase: 55 U/L (ref 40–150)
Anion Gap: 6 mEq/L (ref 3–11)
BUN: 17.6 mg/dL (ref 7.0–26.0)
CALCIUM: 9.3 mg/dL (ref 8.4–10.4)
CHLORIDE: 103 meq/L (ref 98–109)
CO2: 30 mEq/L — ABNORMAL HIGH (ref 22–29)
Creatinine: 1.5 mg/dL — ABNORMAL HIGH (ref 0.7–1.3)
Glucose: 90 mg/dl (ref 70–140)
POTASSIUM: 4.9 meq/L (ref 3.5–5.1)
Sodium: 139 mEq/L (ref 136–145)
TOTAL PROTEIN: 6.9 g/dL (ref 6.4–8.3)
Total Bilirubin: 1.12 mg/dL (ref 0.20–1.20)

## 2014-01-30 LAB — CBC WITH DIFFERENTIAL/PLATELET
BASO%: 1.1 % (ref 0.0–2.0)
BASOS ABS: 0 10*3/uL (ref 0.0–0.1)
EOS%: 2.3 % (ref 0.0–7.0)
Eosinophils Absolute: 0.1 10*3/uL (ref 0.0–0.5)
HCT: 45 % (ref 38.4–49.9)
HEMOGLOBIN: 14.9 g/dL (ref 13.0–17.1)
LYMPH#: 0.5 10*3/uL — AB (ref 0.9–3.3)
LYMPH%: 13.8 % — ABNORMAL LOW (ref 14.0–49.0)
MCH: 28.5 pg (ref 27.2–33.4)
MCHC: 33.1 g/dL (ref 32.0–36.0)
MCV: 86 fL (ref 79.3–98.0)
MONO#: 0.5 10*3/uL (ref 0.1–0.9)
MONO%: 12.2 % (ref 0.0–14.0)
NEUT#: 2.8 10*3/uL (ref 1.5–6.5)
NEUT%: 70.6 % (ref 39.0–75.0)
Platelets: 226 10*3/uL (ref 140–400)
RBC: 5.24 10*6/uL (ref 4.20–5.82)
RDW: 13.8 % (ref 11.0–14.6)
WBC: 4 10*3/uL (ref 4.0–10.3)

## 2014-01-30 LAB — T4, FREE: Free T4: 1.3 ng/dL (ref 0.80–1.80)

## 2014-01-30 LAB — TSH CHCC: TSH: 2.53 m(IU)/L (ref 0.320–4.118)

## 2014-01-30 NOTE — Assessment & Plan Note (Signed)
This is an unfortunate side effects of treatment. Continue conservative management.

## 2014-01-30 NOTE — Assessment & Plan Note (Signed)
This is chronic. PCP to follow.

## 2014-01-30 NOTE — Assessment & Plan Note (Signed)
Thyroid function test is satisfactory. I recommend PCP to check thyroid function in the future.

## 2014-01-30 NOTE — Progress Notes (Signed)
[name] FOLLOW-UP progress notes  Patient Care Team: Windy Fast, MD as PCP - General (Internal Medicine) Heath Lark, MD as Consulting Physician (Hematology and Oncology)  CHIEF COMPLAINTS/PURPOSE OF VISIT:  History of right tonsil cancer  HISTORY OF PRESENTING ILLNESS:  Adam Chambers. 73 y.o. male was transferred to my care after his prior physician has left.  I reviewed the patient's records extensive and collaborated the history with the patient. Summary of his history is as follows: The patient was diagnosed with tonsil cancer after he palpated a lump in the neck. 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 HPV status not determined, samples have not been released by the [place].   PAST THERAPY: He is status post one cycle of cisplatin back on 12/28/2009, however, due to acute renal insufficiency with other toxicity related to the cisplatin we changed his regimen to Taxol, carboplatin which he received on 02/02/2010 and 02/09/2010. He also had a course of radiation therapy from 12/28/2009 through 02/09/2010.   He continued to have persistent dry mouth, altered taste sensation and dysphagia. He has poor energy. Denies any new lumps.  MEDICAL HISTORY:  Past Medical History  Diagnosis Date  . 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 Hx of radiation therapy 5/9/211-02/09/2010    rad tx tonsillar region   . History of chemotherapy 12/28/09 & then again 02/02/10 & 6/21/211    1 cycle cisplatin, then taxol & carboplatin  . Spinal stenosis     cervical and lumbar  . Blood transfusion   . CHF (congestive heart failure)   . Hepatitis 1995    food?  . BPH (benign prostatic hypertrophy)   . Chronic back pain   . Allergic rhinitis   . Otitis media      left eye  . Hypertension   . 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 Hypothyroid 02/02/2012  . Hypothyroid 02/02/2012  . Blurred vision, left eye     SURGICAL HISTORY: Past Surgical History  Procedure Laterality Date  . Cervical fusion    . Refractive surgery      left eye  . Gastrostomy tube placement      for nutrition /hydration , s/p mucositis assoc with odynophagia&dysphasia  . Tonsillectomy    . Back surgery      1992    SOCIAL HISTORY: History   Social History  . Marital Status: Married    Spouse Name: N/A    Number of Children: N/A  . Years of Education: N/A   Occupational History  . Not on file.   Social History Main Topics  . Smoking status: Former Smoker -- 2.00 packs/day for 34 years    Types: Cigarettes    Quit date: 06/30/1993  . Smokeless tobacco: Never Used  . Alcohol Use: No  . Drug Use: No  . Sexual Activity: Not on file   Other Topics Concern  . Not on file   Social History Narrative  . No narrative on file    FAMILY HISTORY: Family History  Problem Relation Age of Onset  . Lung cancer Sister   . Diabetes Brother     ALLERGIES:  is allergic to simvastatin.  MEDICATIONS:  Current Outpatient Prescriptions  Medication Sig Dispense Refill  . amLODipine (NORVASC)  5 MG tablet Take 10 mg by mouth daily.       . benazepril (LOTENSIN) 20 MG tablet Take 20 mg by mouth daily.      . diazepam (VALIUM) 5 MG tablet Take 5 mg by mouth every 6 (six) hours as needed.        . doxazosin (CARDURA) 8 MG tablet Take 8 mg by mouth daily.        . flunisolide (NASAREL) 29 MCG/ACT (0.025%) nasal spray Place 2 sprays into the nose daily as needed. Dose is for each nostril.       Marland Kitchen levothyroxine (SYNTHROID) 75 MCG tablet Take 1 tablet (75 mcg total) by mouth daily before breakfast.  90 tablet  3  . metoprolol (TOPROL-XL) 200 MG 24 hr tablet Take 200 mg by mouth daily. Take 1/2 tab daily      . oxyCODONE-acetaminophen (PERCOCET/ROXICET)  5-325 MG per tablet Take 1-2 tablets by mouth every 6 (six) hours as needed for severe pain.  20 tablet  0  . pilocarpine (SALAGEN) 5 MG tablet Take 1 tablet (5 mg total) by mouth 3 (three) times daily.  90 tablet  6  . senna (SENOKOT) 8.6 MG TABS Take 1 tablet by mouth daily as needed.        . sildenafil (VIAGRA) 100 MG tablet Take 100 mg by mouth daily as needed for erectile dysfunction. Takes 1/2 tablet       No current facility-administered medications for this visit.    REVIEW OF SYSTEMS:   Constitutional: Denies fevers, chills or abnormal night sweats Eyes: Denies blurriness of vision, double vision or watery eyes Ears, nose, mouth, throat, and face: Denies mucositis or sore throat Respiratory: Denies cough, dyspnea or wheezes Cardiovascular: Denies palpitation, chest discomfort or lower extremity swelling Gastrointestinal:  Denies nausea, heartburn or change in bowel habits Skin: Denies abnormal skin rashes Lymphatics: Denies new lymphadenopathy or easy bruising Neurological:Denies numbness, tingling or new weaknesses Behavioral/Psych: Mood is stable, no new changes  All other systems were reviewed with the patient and are negative.  PHYSICAL EXAMINATION: ECOG PERFORMANCE STATUS: 1 - Symptomatic but completely ambulatory  Filed Vitals:   01/30/14 1134  BP: 159/87  Pulse: 45  Temp: 97.9 F (36.6 C)  Resp: 20   Filed Weights   01/30/14 1134  Weight: 196 lb 1.6 oz (88.95 kg)    GENERAL:alert, no distress and comfortable he looks thin but not cachectic SKIN: skin color, texture, turgor are normal, no rashes or significant lesions EYES: normal, conjunctiva are pink and non-injected, sclera clear OROPHARYNX:no exudate, normal lips, buccal mucosa, and tongue  NECK: Neck is thick from prior radiation induced fibrosis LYMPH:  no palpable lymphadenopathy in the cervical, axillary or inguinal LUNGS: clear to auscultation and percussion with normal breathing effort HEART:  regular rate & rhythm and no murmurs without lower extremity edema ABDOMEN:abdomen soft, non-tender and normal bowel sounds Musculoskeletal:no cyanosis of digits and no clubbing  PSYCH: alert & oriented x 3 with fluent speech NEURO: no focal motor/sensory deficits  LABORATORY DATA:  I have reviewed the data as listed Lab Results  Component Value Date   WBC 4.0 01/30/2014   HGB 14.9 01/30/2014   HCT 45.0 01/30/2014   MCV 86.0 01/30/2014   PLT 226 01/30/2014    Recent Labs  01/30/14 1135  NA 139  K 4.9  CO2 30*  GLUCOSE 90  BUN 17.6  CREATININE 1.5*  CALCIUM 9.3  PROT 6.9  ALBUMIN 3.9  AST 15  ALT 11  ALKPHOS 55  BILITOT 1.12   ASSESSMENT & PLAN:  Tonsil cancer Clinically, he has no evidence of recurrence. I would discharge him from the clinic recommend ENT followup only.  Hypothyroid Thyroid function test is satisfactory. I recommend PCP to check thyroid function in the future.  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 Continue conservative management.  Dysphagia This is due to side effects of radiation. The patient continued to swallow food carefully.  Elevated serum creatinine This is chronic. PCP to follow.    All questions were answered. The patient knows to call the clinic with any problems, questions or concerns. I spent 25 minutes counseling the patient face to face. The total time spent in the appointment was 30 minutes and more than 50% was on counseling.     [facility], [place], MD 01/30/2014 9:19 PM

## 2014-01-30 NOTE — Assessment & Plan Note (Signed)
This is due to side effects of radiation. The patient continued to swallow food carefully.

## 2014-01-30 NOTE — Assessment & Plan Note (Signed)
Clinically, he has no evidence of recurrence. I would discharge him from the clinic recommend ENT followup only.

## 2014-01-31 ENCOUNTER — Telehealth: Payer: Self-pay | Admitting: *Deleted

## 2014-01-31 NOTE — Telephone Encounter (Signed)
Message copied by Cathlean Cower on Fri Jan 31, 2014  3:29 PM ------      Message from: [facility], [place]: Thu Jan 30, 2014  8:11 PM      Regarding: thyroid function       Pls let him know it is normal      ----- Message -----         From: [facility] Interface         Sent: 01/30/2014  11:39 AM           To: Heath Lark, MD                   ------

## 2014-01-31 NOTE — Telephone Encounter (Signed)
Left VM for pt to return nurse's call.  

## 2014-03-21 HISTORY — PX: ESOPHAGOGASTRODUODENOSCOPY: SHX1529

## 2014-03-22 DIAGNOSIS — C159 Malignant neoplasm of esophagus, unspecified: Secondary | ICD-10-CM

## 2014-03-22 HISTORY — DX: Malignant neoplasm of esophagus, unspecified: C15.9

## 2014-04-11 ENCOUNTER — Telehealth: Payer: Self-pay | Admitting: *Deleted

## 2014-04-11 NOTE — Telephone Encounter (Signed)
On 04-10-14 the pt came by fill out release form check with Dr. Pablo Ledger ok for me to give medical records to pt, it was consult note, end of tx note, sim & tx planning note, follow up note

## 2014-04-14 ENCOUNTER — Telehealth: Payer: Self-pay | Admitting: Oncology

## 2014-04-14 NOTE — Telephone Encounter (Signed)
Returned Adam Chambers's call.  He said he wanted to make sure that we know he is coming at 7:30 on Wednesday to see Dr. Sondra Come.  Advised him that we do have him scheduled for Wednesday at 7:30 am.

## 2014-04-15 ENCOUNTER — Encounter: Payer: Self-pay | Admitting: Radiation Oncology

## 2014-04-15 NOTE — Progress Notes (Signed)
Thoracic Location of Tumor / Histology: esophageal cancer - invasive squamous cell  Patient presented with worsening dysphagia with solid food.  Biopsies from 03/21/14 revealed:    Tobacco/Marijuana/Snuff/ETOH use: smoked 2 packs per day for 34 years.  Quit in 1994. No ETOH use.  Past/Anticipated interventions by cardiothoracic surgery, if any: EGD on 03/21/14  Past/Anticipated interventions by medical oncology, if any: 12/28/09 cycle cisplatin, then taxol & carboplatin received 02/02/10, 02/09/10. No FU with Dr Alvy Bimler scheduled at this time.  Signs/Symptoms  Weight changes, if any: no, good appetite  Respiratory complaints, if any: no  Hemoptysis, if any: no  Pain issues, if any:  Chronic low back pain, s/p 5 back surgeries and pain radiates down left leg, shoulder pain, right more than left. Pain in throat with swallowing at times.  SAFETY ISSUES:  Prior radiation? Yes 12/28/2009-02/09/2010 tonsillar region 6810 cGy in 30 fractions, associated lymphadenopathy treated to same dosing  Pacemaker/ICD? no   Possible current pregnancy? na  Is the patient on methotrexate? no  Current Complaints / other details:  Patient has a history of right tonsil cancer in 2011. He states that ever since he has had some dysphagia and sore throat. This has worsened recently.

## 2014-04-16 ENCOUNTER — Ambulatory Visit
Admission: RE | Admit: 2014-04-16 | Discharge: 2014-04-16 | Disposition: A | Discharge status: Home / Self Care | Payer: Non-veteran care | Source: Ambulatory Visit | Attending: Radiation Oncology | Admitting: Radiation Oncology

## 2014-04-16 ENCOUNTER — Encounter: Payer: Self-pay | Admitting: Radiation Oncology

## 2014-04-16 VITALS — BP 154/88 | HR 50 | Temp 97.9°F | Resp 20 | Ht 76.0 in | Wt 194.2 lb

## 2014-04-16 DIAGNOSIS — Z85819 Personal history of malignant neoplasm of unspecified site of lip, oral cavity, and pharynx: Secondary | ICD-10-CM | POA: Diagnosis not present

## 2014-04-16 DIAGNOSIS — N400 Enlarged prostate without lower urinary tract symptoms: Secondary | ICD-10-CM | POA: Diagnosis not present

## 2014-04-16 DIAGNOSIS — I509 Heart failure, unspecified: Secondary | ICD-10-CM | POA: Insufficient documentation

## 2014-04-16 DIAGNOSIS — I10 Essential (primary) hypertension: Secondary | ICD-10-CM | POA: Diagnosis not present

## 2014-04-16 DIAGNOSIS — Z510 Encounter for antineoplastic radiation therapy: Secondary | ICD-10-CM | POA: Diagnosis not present

## 2014-04-16 DIAGNOSIS — C159 Malignant neoplasm of esophagus, unspecified: Secondary | ICD-10-CM | POA: Insufficient documentation

## 2014-04-16 DIAGNOSIS — N529 Male erectile dysfunction, unspecified: Secondary | ICD-10-CM | POA: Insufficient documentation

## 2014-04-16 DIAGNOSIS — Z87891 Personal history of nicotine dependence: Secondary | ICD-10-CM | POA: Insufficient documentation

## 2014-04-16 DIAGNOSIS — E039 Hypothyroidism, unspecified: Secondary | ICD-10-CM | POA: Insufficient documentation

## 2014-04-16 DIAGNOSIS — Z9221 Personal history of antineoplastic chemotherapy: Secondary | ICD-10-CM | POA: Insufficient documentation

## 2014-04-16 DIAGNOSIS — M549 Dorsalgia, unspecified: Secondary | ICD-10-CM | POA: Insufficient documentation

## 2014-04-16 DIAGNOSIS — C153 Malignant neoplasm of upper third of esophagus: Secondary | ICD-10-CM | POA: Insufficient documentation

## 2014-04-16 DIAGNOSIS — Z923 Personal history of irradiation: Secondary | ICD-10-CM | POA: Insufficient documentation

## 2014-04-16 DIAGNOSIS — G8929 Other chronic pain: Secondary | ICD-10-CM | POA: Insufficient documentation

## 2014-04-16 DIAGNOSIS — Z981 Arthrodesis status: Secondary | ICD-10-CM | POA: Diagnosis not present

## 2014-04-16 HISTORY — DX: Malignant neoplasm of esophagus, unspecified: C15.9

## 2014-04-16 MED ORDER — LARYNGOSCOPY SOLUTION RAD-ONC
15.0000 mL | Freq: Once | TOPICAL | Status: AC
  Administered 2014-04-16: 15 mL via TOPICAL
  Filled 2014-04-16: qty 15

## 2014-04-16 NOTE — Progress Notes (Signed)
Radiation Oncology         [phone]) [phone] ________________________________  Initial outpatient Consultation  Name: Adam Chambers. 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 is a 73 y.o. male who is seen out of the courtesy of Dr. Geraldo Pitter  of the [facility] for evaluation as it relates to the patient's recently diagnosed squamous cell carcinoma the proximal esophagus. Patient has prior history of Right tonsillar carcinoma undergoing bilateral tonsillectomy positive margins followed by postoperative radiation therapy and radiosensitizing chemotherapy. The patient completed his radiation therapy 4 years and 2 months ago. Total dose to the tonsillar region was 68.10 gray. The patient received radiation treatments to the bilateral neck and supraclavicular region. Patient has done well concerning his tonsillar carcinoma with no evidence of recurrence. He has had chronic mild swallowing problems related to his prior tonsillectomy and postop radiation therapy. Earlier this summer the patient began having more problems with swallowing. He is being evaluated for esophageal dilation and underwent esophagoscopy. The patient was noted to have a lesion approximately 20 cm from the incisors. Biopsy of this area was performed which showed moderate to poorly differentiated squamous cell carcinoma, invasive. The patient proceeded to undergo a PET scan which did show uptake in the cervical esophagus consistent with the malignancy. There was no other hypermetabolic activity within the neck chest or other areas. The patient underwent endoscopic ultrasound  On August 21 at [facility]. The patient was noted to have a 2.0 x 1.0 cm irregular hypoechoic mass which was 20 cm  from the incisors. The mass involved approximately 30% of the esophageal circumference. The mass did invade into the muscularis propria. There is no regional lymph node metastasis on ultrasound. Due to the location of the lesion and the patient's prior radiation therapy he has not felt to be a candidate for surgery and with muscle involvement the patient is not candidate for superficial therapy. With this information the patient is now seen in radiation oncology for consideration for treatment.    PREVIOUS RADIATION THERAPY: Yes treatment for stage III tonsillar cancer. As above the patient received 68.1 Gy using intensity modulated radiation therapy. The patient's radiation fields did extend into the supraclavicular region.   PAST MEDICAL HISTORY:  has a past medical history of Tonsil cancer (4/8/211); radiation therapy (5/9/211-02/09/2010); History of chemotherapy (12/28/09 & then again 02/02/10 & 6/21/211); Spinal stenosis; Blood transfusion; CHF (congestive heart failure); Hepatitis (1995); BPH (benign prostatic hypertrophy); Chronic back pain; Allergic rhinitis; Otitis media; Hypertension; Thyroid disease; Hypothyroid (02/02/2012); Hypothyroid (02/02/2012); Blurred vision, left eye; Esophageal cancer (03/2014); and Squamous cell esophageal cancer (03/2014).    PAST SURGICAL HISTORY: Past Surgical History  Procedure Laterality Date  . Cervical fusion    . Refractive surgery      left eye  . Gastrostomy tube placement      for nutrition /hydration , s/p mucositis assoc with odynophagia&dysphasia  . Tonsillectomy    . Back surgery      1992  . Esophagogastroduodenoscopy  03/21/14    FAMILY HISTORY: family history includes Diabetes in his brother; Lung cancer in his sister.  SOCIAL HISTORY:  reports that he quit smoking about 20 years ago. His smoking use included Cigarettes. He has a 68 pack-year smoking  history. He has never used smokeless tobacco. He reports that he does not drink alcohol or use  illicit drugs.  ALLERGIES: Simvastatin  MEDICATIONS:  Current Outpatient Prescriptions  Medication Sig Dispense Refill  . amLODipine (NORVASC) 5 MG tablet Take 10 mg by mouth daily.       . benazepril (LOTENSIN) 20 MG tablet Take 20 mg by mouth daily.      . diazepam (VALIUM) 5 MG tablet Take 5 mg by mouth every 6 (six) hours as needed.        . doxazosin (CARDURA) 8 MG tablet Take 8 mg by mouth daily.        . flunisolide (NASAREL) 29 MCG/ACT (0.025%) nasal spray Place 2 sprays into the nose daily as needed. Dose is for each nostril.       Marland Kitchen levothyroxine (SYNTHROID) 75 MCG tablet Take 1 tablet (75 mcg total) by mouth daily before breakfast.  90 tablet  3  . metoprolol (TOPROL-XL) 200 MG 24 hr tablet Take 200 mg by mouth daily. Take 1/2 tab daily      . oxyCODONE-acetaminophen (PERCOCET/ROXICET) 5-325 MG per tablet Take 1-2 tablets by mouth every 6 (six) hours as needed for severe pain.  20 tablet  0  . Phenylephrine HCl (LARYNGOSCOPY SOLUTION RAD-ONC) SOLN Apply 15 mLs topically.      . senna (SENOKOT) 8.6 MG TABS Take 1 tablet by mouth daily as needed.        . sildenafil (VIAGRA) 100 MG tablet Take 100 mg by mouth daily as needed for erectile dysfunction. Takes 1/2 tablet       No current facility-administered medications for this encounter.    REVIEW OF SYSTEMS:  A 15 point review of systems is documented in the electronic medical record. This was obtained by the nursing staff. However, I reviewed this with the patient to discuss relevant findings and make appropriate changes.  patient has some mild pain with swallowing and dysphagia as above. He is unable eat meats but can take in soft foods and liquids. He denies any cough or breathing problems.   PHYSICAL EXAM:  height is 6\' 4"  (1.93 m) and weight is 194 lb 3.2 oz (88.089 kg). His temperature is 97.9 F (36.6 C). His blood pressure is 154/88 and his pulse is 50. His respiration is 20.   BP 154/88  Pulse 50  Temp(Src) 97.9 F (36.6  C)  Resp 20  Ht 6\' 4"  (1.93 m)  Wt 194 lb 3.2 oz (88.089 kg)  BMI 23.65 kg/m2  General Appearance:    Alert, cooperative, no distress, appears stated age, accompanied by his wife on evaluation today   Head:    Normocephalic, without obvious abnormality, atraumatic  Eyes:    PERRL, conjunctiva/corneas clear, EOM's intact,         Ears:    Normal TM's and external ear canals, both ears  Nose:   Nares normal, septum midline, mucosa normal, no drainage    or sinus tenderness  Throat:   Lips, mucosa, and tongue normal;  gums normal, radiation changes noted in the posterior pharynx without signs of recurrence. Palpation along the base of tongue and tonsillar area revealed no suspicious induration.   Neck:   Supple, symmetrical, trachea midline, no adenopathy;       thyroid:  No enlargement/tenderness/nodules; no carotid   bruit or JVD  Back:     Symmetric, no curvature, ROM normal, no CVA tenderness  Lungs:     Clear to auscultation  bilaterally, respirations unlabored  Chest wall:    No tenderness or deformity  Heart:    Regular rate and rhythm, S1 and S2 normal, no murmur, rub   or gallop  Abdomen:     Soft, non-tender, bowel sounds active all four quadrants,    no masses, no organomegaly        Extremities:   Extremities normal, atraumatic, no cyanosis or edema  Pulses:   2+ and symmetric all extremities  Skin:   Skin color, texture, turgor normal, no rashes or lesions  Lymph nodes:   Cervical, supraclavicular, and axillary nodes normal  Neurologic:    Normal strength, sensation and reflexes      Throughout   Nasopharyngoscopy procedure: Topical anesthetic and decongestant was placed in the nasal cavities. Patient  did undergo fiberoptic examination through the right nasal cavity. Radiation changes were noted in the tonsillar area without recurrence. The vocal cords moved well on examination. No lesions noted in the laryngeal area. I was unable to visualize the cervical esophageal area  due to anatomic limitations.   ECOG = 1    1 - Symptomatic but completely ambulatory (Restricted in physically strenuous activity but ambulatory and able to carry out work of a light or sedentary nature. For example, light housework, office work)  LABORATORY DATA:  Lab Results  Component Value Date   WBC 4.0 01/30/2014   HGB 14.9 01/30/2014   HCT 45.0 01/30/2014   MCV 86.0 01/30/2014   PLT 226 01/30/2014   NEUTROABS 2.8 01/30/2014   Lab Results  Component Value Date   NA 139 01/30/2014   K 4.9 01/30/2014   CL 104 01/29/2013   CO2 30* 01/30/2014   GLUCOSE 90 01/30/2014   CREATININE 1.5* 01/30/2014   CALCIUM 9.3 01/30/2014      RADIOGRAPHY: No results found.    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 As above the patient is not a candidate for surgery due to the location.  I have reviewed the patient's previous radiation fields as it relates to this area of new malignancy and this area is in the lower aspect of his previous radiation treatment.   With extensive planning and intensity modulated radiation therapy the patient would be able to obtain additional radiation therapy to this area but field length and total dose would be somewhat limited. The patient may also be a candidate for brachytherapy treatments as part of his radiation therapy management. In addition the patient would likely benefit from radiosensitizing chemotherapy since our total radiation dose will be limited in light of his previous treatment for tonsillar cancer.   PLAN: the patient will be scheduled for simulation planning and treatment. I am unsure whether the patient would potentially receive his radiosensitizing chemotherapy at the [facility] in [place] or  here in [place].   I spent 80 minutes minutes face to face with the patient and more than 50% of that time was spent in counseling and/or coordination of care.     ------------------------------------------------  Blair Promise, PhD, MD

## 2014-04-16 NOTE — Progress Notes (Signed)
Please see the Nurse Progress Note in the MD Initial Consult Encounter for this patient. 

## 2014-04-23 ENCOUNTER — Encounter: Payer: Self-pay | Admitting: *Deleted

## 2014-04-23 NOTE — Progress Notes (Signed)
[facility] Psychosocial Distress Screening Clinical Social Work  Clinical Social Work was referred by distress screening protocol.  The patient scored a 5 on the Psychosocial Distress Thermometer which indicates moderate distress. Clinical Social Worker contacted patient by phone to assess for distress and other psychosocial needs. Mr. Loken reported his main cause of distress at this time is meeting with this [facility] physician in a timely manner- he is scheduled to meet with them on 05/07/14.  He is very eager to start treatment and expressed fear with "the cancer growing while I am waiting".  Mr. Mayberry shared his pain may be attributed to recent testing and history of back pain.  Mr. Standish continues to stay active- he keeps his morning routine of drinking coffee and reading the newspaper, going for a morning walk at the mall, and bowls twice a week.  He shared he feels he is coping adequately and receives a lot of support from his spouse.  CSW provided brief support and encouraged patient to contact CSW with any additional questions or concerns.  ONCBCN DISTRESS SCREENING 04/16/2014  Screening Type Initial Screening  Elta Guadeloupe the number that describes how much distress you have been experiencing in the past week 5  Practical problem type Food  Emotional problem type Nervousness/Anxiety;Adjusting to illness  Information Concerns Type Lack of info about diagnosis;Lack of info about treatment;Lack of info about complementary therapy choices  Physical Problem type Pain;Sleep/insomnia;Mouth sores/swallowing;Tingling hands/feet;Skin dry/itchy  Other most distressing listed is pain in throat, shoulder and left leg    Clinical Social Worker follow up needed: No.  If yes, follow up plan:  Polo Riley, MSW, LCSW, OSW-C Clinical Social Worker [place] [phone]

## 2014-04-24 ENCOUNTER — Telehealth: Payer: Self-pay | Admitting: Oncology

## 2014-04-24 ENCOUNTER — Other Ambulatory Visit: Payer: Self-pay | Admitting: Radiation Oncology

## 2014-04-24 DIAGNOSIS — C153 Malignant neoplasm of upper third of esophagus: Secondary | ICD-10-CM

## 2014-04-24 NOTE — Telephone Encounter (Signed)
Called Veronica to let her know that Dr. Sondra Come will let them know when [facility] can start radiation.  She said they will be out of town until 05/03/14.  She also said Marcia has an appointment with the medical oncologist at the [facility] on 9/16.  He has not started chemotherapy yet.

## 2014-04-25 ENCOUNTER — Encounter: Payer: Self-pay | Admitting: Internal Medicine

## 2014-05-01 ENCOUNTER — Telehealth: Payer: Self-pay | Admitting: Internal Medicine

## 2014-05-01 ENCOUNTER — Other Ambulatory Visit: Payer: Self-pay

## 2014-05-01 DIAGNOSIS — D490 Neoplasm of unspecified behavior of digestive system: Secondary | ICD-10-CM

## 2014-05-01 NOTE — Telephone Encounter (Signed)
Called by Dr. Sondra Come - this patient needs clips to mark esophageal tumor to guide radiation therapy   Please arrange for me to do EGD at [facility] next week - mod sedation ok  Cancel Nov office appt

## 2014-05-01 NOTE — Telephone Encounter (Signed)
Patient notified of EGD on 05/05/14 at [facility].  All questions answered

## 2014-05-01 NOTE — Telephone Encounter (Signed)
Patient is scheduled for [facility] 05/05/14 11:00.  I have mailed the instructions Left message for patient to call back to discuss

## 2014-05-01 NOTE — Telephone Encounter (Signed)
Phone is busy, I did leave a message on the voicemail.

## 2014-05-05 ENCOUNTER — Encounter (HOSPITAL_COMMUNITY): Payer: Self-pay | Admitting: *Deleted

## 2014-05-05 ENCOUNTER — Ambulatory Visit (HOSPITAL_COMMUNITY): Payer: Non-veteran care

## 2014-05-05 ENCOUNTER — Encounter (HOSPITAL_COMMUNITY)
Admission: RE | Disposition: A | Discharge status: Home / Self Care | Payer: Medicare Other | Source: Ambulatory Visit | Attending: Internal Medicine

## 2014-05-05 ENCOUNTER — Ambulatory Visit (HOSPITAL_COMMUNITY)
Admission: RE | Admit: 2014-05-05 | Discharge: 2014-05-05 | Disposition: A | Discharge status: Home / Self Care | Payer: Non-veteran care | Source: Ambulatory Visit | Attending: Internal Medicine | Admitting: Internal Medicine

## 2014-05-05 DIAGNOSIS — K449 Diaphragmatic hernia without obstruction or gangrene: Secondary | ICD-10-CM | POA: Insufficient documentation

## 2014-05-05 DIAGNOSIS — D490 Neoplasm of unspecified behavior of digestive system: Secondary | ICD-10-CM

## 2014-05-05 DIAGNOSIS — I509 Heart failure, unspecified: Secondary | ICD-10-CM | POA: Diagnosis not present

## 2014-05-05 DIAGNOSIS — I10 Essential (primary) hypertension: Secondary | ICD-10-CM | POA: Diagnosis not present

## 2014-05-05 DIAGNOSIS — Z85819 Personal history of malignant neoplasm of unspecified site of lip, oral cavity, and pharynx: Secondary | ICD-10-CM | POA: Diagnosis not present

## 2014-05-05 DIAGNOSIS — R1310 Dysphagia, unspecified: Secondary | ICD-10-CM | POA: Insufficient documentation

## 2014-05-05 DIAGNOSIS — E890 Postprocedural hypothyroidism: Secondary | ICD-10-CM | POA: Insufficient documentation

## 2014-05-05 DIAGNOSIS — C153 Malignant neoplasm of upper third of esophagus: Secondary | ICD-10-CM | POA: Insufficient documentation

## 2014-05-05 DIAGNOSIS — M48061 Spinal stenosis, lumbar region without neurogenic claudication: Secondary | ICD-10-CM | POA: Diagnosis not present

## 2014-05-05 DIAGNOSIS — Z923 Personal history of irradiation: Secondary | ICD-10-CM | POA: Insufficient documentation

## 2014-05-05 DIAGNOSIS — Z87891 Personal history of nicotine dependence: Secondary | ICD-10-CM | POA: Diagnosis not present

## 2014-05-05 DIAGNOSIS — Z5189 Encounter for other specified aftercare: Secondary | ICD-10-CM | POA: Diagnosis not present

## 2014-05-05 DIAGNOSIS — G8929 Other chronic pain: Secondary | ICD-10-CM | POA: Diagnosis not present

## 2014-05-05 DIAGNOSIS — N400 Enlarged prostate without lower urinary tract symptoms: Secondary | ICD-10-CM | POA: Insufficient documentation

## 2014-05-05 DIAGNOSIS — M4802 Spinal stenosis, cervical region: Secondary | ICD-10-CM | POA: Insufficient documentation

## 2014-05-05 HISTORY — PX: ESOPHAGOGASTRODUODENOSCOPY: SHX5428

## 2014-05-05 SURGERY — EGD (ESOPHAGOGASTRODUODENOSCOPY)
Anesthesia: Moderate Sedation

## 2014-05-05 MED ORDER — BUTAMBEN-TETRACAINE-BENZOCAINE 2-2-14 % EX AERO
INHALATION_SPRAY | CUTANEOUS | Status: DC | PRN
  Administered 2014-05-05: 2 via TOPICAL

## 2014-05-05 MED ORDER — MIDAZOLAM HCL 10 MG/2ML IJ SOLN
INTRAMUSCULAR | Status: DC | PRN
  Administered 2014-05-05: 1 mg via INTRAVENOUS
  Administered 2014-05-05 (×4): 2 mg via INTRAVENOUS

## 2014-05-05 MED ORDER — MIDAZOLAM HCL 10 MG/2ML IJ SOLN
INTRAMUSCULAR | Status: AC
  Filled 2014-05-05: qty 2

## 2014-05-05 MED ORDER — FENTANYL CITRATE 0.05 MG/ML IJ SOLN
INTRAMUSCULAR | Status: AC
  Filled 2014-05-05: qty 2

## 2014-05-05 MED ORDER — SODIUM CHLORIDE 0.9 % IV SOLN
INTRAVENOUS | Status: DC
  Administered 2014-05-05: 500 mL via INTRAVENOUS

## 2014-05-05 MED ORDER — FENTANYL CITRATE 0.05 MG/ML IJ SOLN
INTRAMUSCULAR | Status: DC | PRN
  Administered 2014-05-05 (×3): 25 ug via INTRAVENOUS

## 2014-05-05 NOTE — Discharge Instructions (Addendum)
I placed the clips to mark above the tumor. It could feel like there is something in the throat - that should go away or you should get used to it.  I appreciate the opportunity to care for you. Gatha Mayer, MD, FACG   YOU HAD AN ENDOSCOPIC PROCEDURE TODAY: Refer to the procedure report and other information in the discharge instructions given to you for any specific questions about what was found during the examination. If this information does not answer your questions, please call Dr. Celesta Aver office at [phone] to clarify.   YOU SHOULD EXPECT: Some feelings of bloating in the abdomen. Passage of more gas than usual. Walking can help get rid of the air that was put into your GI tract during the procedure and reduce the bloating. If you had a lower endoscopy (such as a colonoscopy or flexible sigmoidoscopy) you may notice spotting of blood in your stool or on the toilet paper. Some abdominal soreness may be present for a day or two, also.  DIET: Your first meal following the procedure should be a light meal and then it is ok to progress to your normal diet. A half-sandwich or bowl of soup is an example of a good first meal. Heavy or fried foods are harder to digest and may make you feel nauseous or bloated. Drink plenty of fluids but you should avoid alcoholic beverages for 24 hours.   ACTIVITY: Your care partner should take you home directly after the procedure. You should plan to take it easy, moving slowly for the rest of the day. You can resume normal activity the day after the procedure however YOU SHOULD NOT DRIVE, use power tools, machinery or perform tasks that involve climbing or major physical exertion for 24 hours (because of the sedation medicines used during the test).   SYMPTOMS TO REPORT IMMEDIATELY: A gastroenterologist can be reached at any hour. Please call [phone]  for any of the following symptoms:  Following lower endoscopy (colonoscopy, flexible  sigmoidoscopy) Excessive amounts of blood in the stool  Significant tenderness, worsening of abdominal pains  Swelling of the abdomen that is new, acute  Fever of 100 or higher  Following upper endoscopy (EGD, EUS, ERCP, esophageal dilation) Vomiting of blood or coffee ground material  New, significant abdominal pain  New, significant chest pain or pain under the shoulder blades  Painful or persistently difficult swallowing  New shortness of breath  Black, tarry-looking or red, bloody stools  FOLLOW UP:  If any biopsies were taken you will be contacted by phone or by letter within the next 1-3 weeks. Call [phone]  if you have not heard about the biopsies in 3 weeks.  Please also call with any specific questions about appointments or follow up tests.  Esophagogastroduodenoscopy Care After Refer to this sheet in the next few weeks. These instructions provide you with information on caring for yourself after your procedure. Your caregiver may also give you more specific instructions. Your treatment has been planned according to current medical practices, but problems sometimes occur. Call your caregiver if you have any problems or questions after your procedure.  HOME CARE INSTRUCTIONS  Do not eat or drink anything until the numbing medicine (local anesthetic) has worn off and your gag reflex has returned. You will know that the local anesthetic has worn off when you can swallow comfortably.  Do not drive for 12 hours after the procedure or as directed by your caregiver.  Only take medicines as directed by  your caregiver. SEEK MEDICAL CARE IF:   You cannot stop coughing.  You are not urinating at all or less than usual. SEEK IMMEDIATE MEDICAL CARE IF:  You have difficulty swallowing.  You cannot eat or drink.  You have worsening throat or chest pain.  You have dizziness, lightheadedness, or you faint.  You have nausea or vomiting.  You have chills.  You have a  fever.  You have severe abdominal pain.  You have black, tarry, or bloody stools. Document Released: 07/25/2012 Document Reviewed: 07/25/2012 [facility] Patient Information 2015 [place]. This information is not intended to replace advice given to you by your health care provider. Make sure you discuss any questions you have with your health care provider.

## 2014-05-05 NOTE — Op Note (Signed)
[facility] [place], [address]   ENDOSCOPY PROCEDURE REPORT  PATIENT: Adam Chambers, Adam Chambers  MR#: [id number] BIRTHDATE: 03-Dec-1940 , 73  yrs. old GENDER: Male ENDOSCOPIST: Gatha Mayer, MD, [facility] REFERRED BY: PROCEDURE DATE:  05/05/2014 PROCEDURE:  Esophagoscopy ASA CLASS:     Class III INDICATIONS:  Mark cancer prior to XRT. MEDICATIONS: Fentanyl 75 mcg IV and Versed 9 mg IV TOPICAL ANESTHETIC: Cetacaine Spray  DESCRIPTION OF PROCEDURE: After the risks benefits and alternatives of the procedure were thoroughly explained, informed consent was obtained.  The EC-[id number] ([id number]) endoscope was introduced through the mouth and advanced to the stomach cardia. Without limitations. The instrument was slowly withdrawn as the mucosa was fully examined.      At 21-22 cm there was a small lateral tumor with ulceration seen on esophagus. Remainder of esophagus ok - small-moderate hiatal hernia seen Proximal margin of tumor marked with clips x 2 1 just at border and 1 above a bit.  Two other clips failed to hold (4 used total). Retroflexion was not performed.     The scope was then withdrawn from the patient and the procedure completed.  COMPLICATIONS: There were no complications. ENDOSCOPIC IMPRESSION: At 21-22 cm there was a small lateral tumor with ulceration seen on esophagus. Remainder of esophagus ok - small-moderate hiatal hernia seen Proximal margin of tumor marked with clips x 2 1 just at border and 1 above a bit.  Two other clips failed to hold (4 used total).  RECOMMENDATIONS: Neck film Treatment per Dr.  Sondra Come  REPEAT EXAM:  eSigned:  Gatha Mayer, MD, [facility] 05/05/2014 11:10 AM   YI:FOYDX Sondra Come, MD and The Patient

## 2014-05-05 NOTE — H&P (Signed)
[facility] History and Physical   Primary Care Physician:  Windy Fast, MD   Reason for Procedure:   Clip placement to mark esophageal cancer prior to XRT    Plan:    Esophagoscopy/EGD and clip placement      HPI: Adam Chambers. is a 73 y.o. male with a squamous cell carcinoma of cervical esophagus. He has had XRT for tonsillar cancer. Dr. Sondra Come has asked for help with marking the tumor margin to guide XRT. Patient does have solid dysphagia.   Past Medical History  Diagnosis Date  . 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 Hx of radiation therapy 5/9/211-02/09/2010    rad tx tonsillar region   . History of chemotherapy 12/28/09 & then again 02/02/10 & 6/21/211    1 cycle cisplatin, then taxol & carboplatin  . Spinal stenosis     cervical and lumbar  . Blood transfusion   . CHF (congestive heart failure)   . Hepatitis 1995    food?  . BPH (benign prostatic hypertrophy)   . Chronic back pain   . Allergic rhinitis   . Otitis media     left eye  . Hypertension   . 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 Hypothyroid 02/02/2012  . Hypothyroid 02/02/2012  . Blurred vision, left eye   . Esophageal cancer 03/2014    invasive squamous cell  . Squamous cell esophageal cancer 03/2014    invasive    Past Surgical History  Procedure Laterality Date  . Cervical fusion    . Refractive surgery      left eye  . Gastrostomy tube placement      for nutrition /hydration , s/p mucositis assoc with odynophagia&dysphasia  . Tonsillectomy    . Back surgery      1992  . Esophagogastroduodenoscopy  03/21/14    Prior to Admission medications   Medication Sig Start Date End Date Taking? Authorizing Provider  amLODipine (NORVASC) 5 MG tablet Take 10 mg by mouth daily.    Yes Historical Provider, MD  benazepril (LOTENSIN) 20 MG tablet Take 20 mg by mouth daily.   Yes Historical Provider, MD  diazepam (VALIUM) 5 MG tablet Take 5  mg by mouth every 6 (six) hours as needed.     Yes Historical Provider, MD  doxazosin (CARDURA) 8 MG tablet Take 8 mg by mouth daily.     Yes Historical Provider, MD  flunisolide (NASAREL) 29 MCG/ACT (0.025%) nasal spray Place 2 sprays into the nose daily as needed. Dose is for each nostril.    Yes Historical Provider, MD  levothyroxine (SYNTHROID) 75 MCG tablet Take 1 tablet (75 mcg total) by mouth daily before breakfast. 11/19/13  Yes Heath Lark, MD  metoprolol (TOPROL-XL) 200 MG 24 hr tablet Take 200 mg by mouth daily. Take 1/2 tab daily   Yes Historical Provider, MD  oxyCODONE-acetaminophen (PERCOCET/ROXICET) 5-325 MG per tablet Take 1-2 tablets by mouth every 6 (six) hours as needed for severe pain. 12/08/13  Yes Janne Napoleon, NP  senna (SENOKOT) 8.6 MG TABS Take 1 tablet by mouth daily as needed.     Yes Historical Provider, MD  sildenafil (VIAGRA) 100 MG tablet Take 100 mg by mouth daily as needed for erectile dysfunction. Takes 1/2 tablet   Yes Historical Provider, MD    Current Facility-Administered Medications  Medication Dose Route Frequency Provider Last Rate Last Dose  . 0.9 %  sodium chloride infusion   Intravenous Continuous Gatha Mayer, MD 20 mL/hr at 05/05/14 1015 500 mL at 05/05/14 1015    Allergies as of 05/01/2014 - Review Complete 04/16/2014  Allergen Reaction Noted  . Simvastatin Nausea And Vomiting 07/01/2011    Family History  Problem Relation Age of Onset  . Lung cancer Sister   . Diabetes Brother     History   Social History  . Marital Status: Married    Spouse Name: N/A    Number of Children: N/A  . Years of Education: N/A   Occupational History  . Not on file.   Social History Main Topics  . Smoking status: Former Smoker -- 2.00 packs/day for 34 years    Types: Cigarettes    Quit date: 06/30/1993  . Smokeless tobacco: Never Used  . Alcohol Use: No  . Drug Use: No  . Sexual Activity: Not on file   Other Topics Concern  . Not on file   Social  History Narrative  . No narrative on file    Review of Systems: Positive for eyeglasses. All other review of systems negative except as mentioned in the HPI.  Physical Exam: Vital signs in last 24 hours: Temp:  [97.8 F (36.6 C)] 97.8 F (36.6 C) (09/14 1018) Pulse Rate:  [44] 44 (09/14 1018) Resp:  [12] 12 (09/14 1018) BP: (191)/(97) 191/97 mmHg (09/14 1018) SpO2:  [100 %] 100 % (09/14 1018)   General:   Alert,  Well-developed, well-nourished, pleasant and cooperative in NAD Lungs:  Clear throughout to auscultation.   Heart:  Regular rate and rhythm; no murmurs, clicks, rubs,  or gallops. Abdomen:  Soft, nontender and nondistended. Normal bowel sounds.   Neuro/Psych:  Alert and cooperative. Normal mood and affect. A and O x 3   @Erendida Wrenn  Simonne Maffucci, MD, [facility] [phone] (pager) 05/05/2014 10:38 AM@

## 2014-05-06 ENCOUNTER — Ambulatory Visit
Admission: RE | Admit: 2014-05-06 | Discharge: 2014-05-06 | Disposition: A | Discharge status: Home / Self Care | Payer: Non-veteran care | Source: Ambulatory Visit | Attending: Radiation Oncology | Admitting: Radiation Oncology

## 2014-05-06 ENCOUNTER — Encounter (HOSPITAL_COMMUNITY): Payer: Self-pay | Admitting: Internal Medicine

## 2014-05-06 DIAGNOSIS — C153 Malignant neoplasm of upper third of esophagus: Secondary | ICD-10-CM

## 2014-05-06 DIAGNOSIS — Z510 Encounter for antineoplastic radiation therapy: Secondary | ICD-10-CM | POA: Diagnosis not present

## 2014-05-08 NOTE — Progress Notes (Signed)
  Radiation Oncology         [phone]) [phone] ________________________________  Name: Adam Chambers. 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  Identity was confirmed.  All relevant records and images related to the planned course of therapy were reviewed.  The patient freely provided informed written consent to proceed with treatment after reviewing the details related to the planned course of therapy. The consent form was witnessed and verified by the simulation staff.  Then, the patient was set-up in a stable reproducible  supine position for radiation therapy.  CT images were obtained.  Surface markings were placed.  The CT images were loaded into the planning software.  Then the target and avoidance structures were contoured.  Treatment planning then occurred.  The radiation prescription was entered and confirmed.  Then, I designed and supervised the construction of a total of 1 medically necessary complex treatment devices.  I have requested : Intensity Modulated Radiotherapy (IMRT) is medically necessary for this case for the following reason:  Previous treatment to this area..  I have ordered:dose calc.  PLAN:  The patient will receive 45 Gy in 25 fractions with a simultaneous integrated boost to 50 Gy. Patient will also receive radiosensitizing chemotherapy.  ________________________________  Special treatment procedure note   The patient will be receiving radiosensitizing chemotherapy during the course of his treatment. Given the increased potential for toxicities as well as the necessity for close monitoring of the patient and bloodwork, this constitutes a special treatment procedure. -----------------------------------  Blair Promise, PhD, MD

## 2014-05-09 DIAGNOSIS — Z510 Encounter for antineoplastic radiation therapy: Secondary | ICD-10-CM | POA: Diagnosis not present

## 2014-05-12 ENCOUNTER — Telehealth: Payer: Self-pay | Admitting: Oncology

## 2014-05-12 NOTE — Telephone Encounter (Signed)
Called Veronica and left a message to call back.

## 2014-05-12 NOTE — Telephone Encounter (Signed)
Called Adam Chambers to see when his chemotherapy appointment is in [place].  Adam Chambers said his appointment is at 11:00 on Fridays.  Transferred him to Faith, RT on [place] to reschedule his Friday appointments.

## 2014-05-13 ENCOUNTER — Telehealth: Payer: Self-pay | Admitting: Oncology

## 2014-05-13 DIAGNOSIS — Z510 Encounter for antineoplastic radiation therapy: Secondary | ICD-10-CM | POA: Diagnosis not present

## 2014-05-13 NOTE — Telephone Encounter (Signed)
Talked to [place] regarding Adam Chambers having chemotherapy in [place].  Advised her that he would not be able to have his chemotherapy planned in time to have concurrent radiation treatments if he was to have it done in [place].  Veronica verbalized agreement.  Also discussed treatment on Friday's.  He is currently scheduled for chemotherapy at 10 am.  [name] would like him to have his radiation in the mornings.  [name] said she would discuss this with the therapists on Thursday.

## 2014-05-14 ENCOUNTER — Encounter: Payer: Self-pay | Admitting: Radiation Oncology

## 2014-05-14 NOTE — Progress Notes (Signed)
[place] contact:  Ms. Tonny Branch, [phone] ext [phone]; fax [phone]

## 2014-05-15 ENCOUNTER — Ambulatory Visit
Admission: RE | Admit: 2014-05-15 | Discharge: 2014-05-15 | Disposition: A | Discharge status: Home / Self Care | Payer: Non-veteran care | Source: Ambulatory Visit | Attending: Radiation Oncology | Admitting: Radiation Oncology

## 2014-05-15 DIAGNOSIS — C153 Malignant neoplasm of upper third of esophagus: Secondary | ICD-10-CM

## 2014-05-15 DIAGNOSIS — Z510 Encounter for antineoplastic radiation therapy: Secondary | ICD-10-CM | POA: Diagnosis not present

## 2014-05-16 ENCOUNTER — Ambulatory Visit
Admission: RE | Admit: 2014-05-16 | Discharge: 2014-05-16 | Disposition: A | Discharge status: Home / Self Care | Payer: Non-veteran care | Source: Ambulatory Visit | Attending: Radiation Oncology | Admitting: Radiation Oncology

## 2014-05-16 DIAGNOSIS — Z510 Encounter for antineoplastic radiation therapy: Secondary | ICD-10-CM | POA: Diagnosis not present

## 2014-05-19 ENCOUNTER — Ambulatory Visit
Admission: RE | Admit: 2014-05-19 | Discharge: 2014-05-19 | Disposition: A | Discharge status: Home / Self Care | Payer: Non-veteran care | Source: Ambulatory Visit | Attending: Radiation Oncology | Admitting: Radiation Oncology

## 2014-05-19 DIAGNOSIS — Z510 Encounter for antineoplastic radiation therapy: Secondary | ICD-10-CM | POA: Diagnosis not present

## 2014-05-20 ENCOUNTER — Ambulatory Visit
Admission: RE | Admit: 2014-05-20 | Discharge: 2014-05-20 | Disposition: A | Discharge status: Home / Self Care | Payer: Non-veteran care | Source: Ambulatory Visit | Attending: Radiation Oncology | Admitting: Radiation Oncology

## 2014-05-20 ENCOUNTER — Encounter: Payer: Self-pay | Admitting: Radiation Oncology

## 2014-05-20 VITALS — BP 135/87 | HR 52 | Temp 98.1°F | Ht 76.0 in | Wt 190.4 lb

## 2014-05-20 DIAGNOSIS — C153 Malignant neoplasm of upper third of esophagus: Secondary | ICD-10-CM

## 2014-05-20 DIAGNOSIS — Z510 Encounter for antineoplastic radiation therapy: Secondary | ICD-10-CM | POA: Diagnosis not present

## 2014-05-20 MED ORDER — BIAFINE EX EMUL
Freq: Once | CUTANEOUS | Status: AC
  Administered 2014-05-20: 16:00:00 via TOPICAL

## 2014-05-20 NOTE — Addendum Note (Signed)
Encounter addended by: Jacqulyn Liner, RN on: 05/20/2014  3:33 PM<BR>     Documentation filed: Inpatient MAR, Orders

## 2014-05-20 NOTE — Progress Notes (Signed)
  Radiation Oncology         [phone]) [phone] ________________________________  Name: Adam Chambers. 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 . . . . . . . The patient presents for routine under treatment assessment.                                   The patient is without complaint. He has some mild swallowing difficulties which he had prior to starting his radiation treatment. The patient can take in soft foods and some meats if chewed wel. l patient has noticed some fatigue. He has started chemotherapy at the [facility].  Patient is scheduled for placement of a feeding tube tomorrow.                                 Set-up films were reviewed.                                 The chart was checked. Physical Findings. . .  height is 6\' 4"  (1.93 m) and weight is 190 lb 6.4 oz (86.365 kg). His oral temperature is 98.1 F (36.7 C). His blood pressure is 135/87 and his pulse is 52. His oxygen saturation is 100%. . No palpable cervical or subclavicular adenopathy. The lungs are clear to auscultation. The heart has a regular rhythm and rate.   Impression . . . . . . . The patient is tolerating radiation. Plan . . . . . . . . . . . . Continue treatment as planned.  The patient's fiducial marker placed prior to his treatment seems to have migrated inferiorly but appears to be stable at this new location.  I am tentatively planning brachytherapy as part of his treatment since we were limited and his total external beam radiation dose in light of his prior radiation therapy for tonsillar carcinoma.  ________________________________   Blair Promise, PhD, MD

## 2014-05-20 NOTE — Progress Notes (Signed)
Adam Chambers has completed 4/13 fractions to his lower neck/upper chest.  He denies pain.  He reports that his mouth is dry and his saliva is thick.  He is having to cough up thick secretions in the mornings.  He reports he has a slight headache today and is feeling like his energy is down.  He had his first chemotherapy on Friday.  He is having a feeding tube put in tomorrow.  He was given the Radiation Therapy and You book and discussed potential side effects/management of fatigue, skin changes, mouth changes and throat changes.  He was also given biafine cream and was instructed to apply it to his lower neck/chest twice a day after treatment and bedtime.  He was advised to call with any questions or concerns.

## 2014-05-21 ENCOUNTER — Ambulatory Visit
Admission: RE | Admit: 2014-05-21 | Discharge: 2014-05-21 | Disposition: A | Discharge status: Home / Self Care | Payer: Non-veteran care | Source: Ambulatory Visit | Attending: Radiation Oncology | Admitting: Radiation Oncology

## 2014-05-21 DIAGNOSIS — Z510 Encounter for antineoplastic radiation therapy: Secondary | ICD-10-CM | POA: Diagnosis not present

## 2014-05-22 ENCOUNTER — Ambulatory Visit
Admission: RE | Admit: 2014-05-22 | Discharge: 2014-05-22 | Disposition: A | Discharge status: Home / Self Care | Payer: Non-veteran care | Source: Ambulatory Visit | Attending: Radiation Oncology | Admitting: Radiation Oncology

## 2014-05-22 DIAGNOSIS — Z931 Gastrostomy status: Secondary | ICD-10-CM | POA: Insufficient documentation

## 2014-05-22 DIAGNOSIS — R1310 Dysphagia, unspecified: Secondary | ICD-10-CM | POA: Insufficient documentation

## 2014-05-22 DIAGNOSIS — Z510 Encounter for antineoplastic radiation therapy: Secondary | ICD-10-CM | POA: Insufficient documentation

## 2014-05-22 DIAGNOSIS — C153 Malignant neoplasm of upper third of esophagus: Secondary | ICD-10-CM | POA: Insufficient documentation

## 2014-05-22 DIAGNOSIS — J029 Acute pharyngitis, unspecified: Secondary | ICD-10-CM | POA: Insufficient documentation

## 2014-05-23 ENCOUNTER — Ambulatory Visit
Admission: RE | Admit: 2014-05-23 | Discharge: 2014-05-23 | Disposition: A | Discharge status: Home / Self Care | Payer: Non-veteran care | Source: Ambulatory Visit | Attending: Radiation Oncology | Admitting: Radiation Oncology

## 2014-05-23 ENCOUNTER — Inpatient Hospital Stay
Admission: RE | Admit: 2014-05-23 | Discharge: 2014-05-23 | Disposition: A | Discharge status: Home / Self Care | Payer: Self-pay | Source: Ambulatory Visit | Attending: Radiation Oncology | Admitting: Radiation Oncology

## 2014-05-23 ENCOUNTER — Encounter: Payer: Self-pay | Admitting: Radiation Oncology

## 2014-05-23 VITALS — BP 145/91 | HR 66 | Temp 98.2°F | Resp 12 | Ht 76.0 in | Wt 187.7 lb

## 2014-05-23 DIAGNOSIS — C153 Malignant neoplasm of upper third of esophagus: Secondary | ICD-10-CM

## 2014-05-23 MED ORDER — OXYCODONE HCL 20 MG/ML PO CONC: 6.0000 mg | ORAL | Status: DC | PRN

## 2014-05-23 NOTE — Progress Notes (Signed)
Kishaun has had 7 fractions to his lower neck/upper esophagus.  He is reporting pain when swallowing, especially cold foods like ice cream.  He reports having chemotherapy today and was told to ask for pain medication to help his sore throat.  He does have percocet but has trouble swallowing the tablets.  He also reports having thick sputum.  He reports having his peg tube put in yesterday.  He has not started using it but said he picked up his supplies today.

## 2014-05-23 NOTE — Progress Notes (Signed)
Converted patient from pill to liquid oxycodone (OxyFast) to be used per PEG

## 2014-05-26 ENCOUNTER — Ambulatory Visit
Admission: RE | Admit: 2014-05-26 | Discharge: 2014-05-26 | Disposition: A | Discharge status: Home / Self Care | Payer: Non-veteran care | Source: Ambulatory Visit | Attending: Radiation Oncology | Admitting: Radiation Oncology

## 2014-05-27 ENCOUNTER — Inpatient Hospital Stay
Admission: RE | Admit: 2014-05-27 | Discharge: 2014-05-27 | Disposition: A | Discharge status: Home / Self Care | Payer: Self-pay | Source: Ambulatory Visit | Attending: Radiation Oncology | Admitting: Radiation Oncology

## 2014-05-27 ENCOUNTER — Ambulatory Visit
Admission: RE | Admit: 2014-05-27 | Discharge: 2014-05-27 | Disposition: A | Discharge status: Home / Self Care | Payer: Non-veteran care | Source: Ambulatory Visit | Attending: Radiation Oncology | Admitting: Radiation Oncology

## 2014-05-27 ENCOUNTER — Encounter: Payer: Self-pay | Admitting: Radiation Oncology

## 2014-05-27 VITALS — BP 138/87 | HR 50 | Resp 18 | Wt 183.8 lb

## 2014-05-27 DIAGNOSIS — C153 Malignant neoplasm of upper third of esophagus: Secondary | ICD-10-CM

## 2014-05-27 NOTE — Progress Notes (Signed)
Four pound weight loss noted since 10/2. Patient reports it is becoming increasingly more painful to swallow. Patient continues to obtain nutrients only by mouth. Encouraged patient to consider beginning osmolite tube feedings ordered by Ernestene Kiel. No sores, ulcerations or thrush noted. Hoarseness noted. Continues to use biafine as directed within treatment field. No skin changes noted within treatment field. Reports he continues to cough up thick rope like sputum. Reports dry mouth continues.

## 2014-05-27 NOTE — Progress Notes (Signed)
  Radiation Oncology         [id number]) [phone] ________________________________  Name: Adam Chambers. MRN: [id number]  Date: 05/27/2014  DOB: 1941/08/05  Weekly Radiation Therapy Management   Current Dose: 18 Gy     Planned Dose:  26 Gy  Narrative . . . . . . . . The patient presents for routine under treatment assessment.                                   The patient is having significant odynophagia.  Patient has been switched OxyFast as he has difficulty swallowing pills. He is scheduled to start using his feeding tube tonight with the pump in place.  He has received 2 cycles of chemotherapy thus far with his radiation therapy. He can barely swallow liquids and minimal amounts of soft foods.                                 Set-up films were reviewed.                                 The chart was checked. Physical Findings. . .  weight is 183 lb 12.8 oz (83.371 kg). His blood pressure is 138/87 and his pulse is 50. His respiration is 18. . The lungs are clear. The heart has regular rhythm and rate. The abdomen is soft and nontender with normal bowel sounds. Impression . . . . . . . The patient is tolerating radiation. Plan . . . . . . . . . . . . Continue treatment as planned.  ________________________________   Blair Promise, PhD, MD

## 2014-05-28 ENCOUNTER — Ambulatory Visit
Admission: RE | Admit: 2014-05-28 | Discharge: 2014-05-28 | Disposition: A | Discharge status: Home / Self Care | Payer: Non-veteran care | Source: Ambulatory Visit | Attending: Radiation Oncology | Admitting: Radiation Oncology

## 2014-05-29 ENCOUNTER — Ambulatory Visit
Admission: RE | Admit: 2014-05-29 | Discharge: 2014-05-29 | Disposition: A | Discharge status: Home / Self Care | Payer: Non-veteran care | Source: Ambulatory Visit | Attending: Radiation Oncology | Admitting: Radiation Oncology

## 2014-05-29 ENCOUNTER — Ambulatory Visit: Payer: Medicare Other | Admitting: Radiation Oncology

## 2014-05-30 ENCOUNTER — Ambulatory Visit
Admission: RE | Admit: 2014-05-30 | Discharge: 2014-05-30 | Disposition: A | Discharge status: Home / Self Care | Payer: Non-veteran care | Source: Ambulatory Visit | Attending: Radiation Oncology | Admitting: Radiation Oncology

## 2014-06-02 ENCOUNTER — Other Ambulatory Visit: Payer: Self-pay | Admitting: Radiation Oncology

## 2014-06-02 ENCOUNTER — Ambulatory Visit: Admission: RE | Admit: 2014-06-02 | Payer: Non-veteran care | Source: Ambulatory Visit

## 2014-06-02 MED ORDER — OXYCODONE HCL 5 MG/5ML PO SOLN: 5.0000 mg | ORAL | Status: AC | PRN

## 2014-06-03 ENCOUNTER — Ambulatory Visit
Admission: RE | Admit: 2014-06-03 | Discharge: 2014-06-03 | Disposition: A | Discharge status: Home / Self Care | Payer: Non-veteran care | Source: Ambulatory Visit | Attending: Radiation Oncology | Admitting: Radiation Oncology

## 2014-06-03 ENCOUNTER — Inpatient Hospital Stay
Admission: RE | Admit: 2014-06-03 | Discharge: 2014-06-03 | Disposition: A | Discharge status: Home / Self Care | Payer: Self-pay | Source: Ambulatory Visit | Attending: Radiation Oncology | Admitting: Radiation Oncology

## 2014-06-03 ENCOUNTER — Encounter: Payer: Self-pay | Admitting: Radiation Oncology

## 2014-06-03 ENCOUNTER — Ambulatory Visit: Payer: Non-veteran care

## 2014-06-03 VITALS — BP 123/79 | HR 50 | Resp 16 | Wt 183.9 lb

## 2014-06-03 DIAGNOSIS — C153 Malignant neoplasm of upper third of esophagus: Secondary | ICD-10-CM

## 2014-06-03 NOTE — Progress Notes (Signed)
Reports nausea for the last two days but, denies emesis. No skin changes noted to upper chest. Encouraged patient to continue to use radiaplex bid as directed. Hoarseness noted. Reports discomfort with swallowing but, denies pain. Reports dry mouth. Denies a productive cough but, does report a persistent cough to clear his throat. Weight and vitals stable.

## 2014-06-03 NOTE — Progress Notes (Signed)
  Radiation Oncology         [id number]) [phone] ________________________________  Name: Adam Chambers. 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 . . . . . . . The patient presents for routine under treatment assessment.                                   The patient is seen to be doing better at this time in terms of his sore throat. He has not used his feeding tube as of yet. Patient can take in liquids and soft foods.  He is scheduled for additional chemotherapy later this week.                                 Set-up films were reviewed.                                 The chart was checked. Physical Findings. . .  weight is 183 lb 14.4 oz (83.416 kg). His blood pressure is 123/79 and his pulse is 50. His respiration is 16. . The lungs are clear. The heart has a regular rhythm and rate. Impression . . . . . . . The patient is tolerating radiation. Plan . . . . . . . . . . . Marland Kitchen the patient has completed his external beam radiation therapy. He will be evaluated for brachytherapy boost using iridium 192 as the high-dose-rate source.  ________________________________   Blair Promise, PhD, MD

## 2014-06-04 ENCOUNTER — Ambulatory Visit: Payer: Non-veteran care

## 2014-06-05 ENCOUNTER — Ambulatory Visit: Payer: Non-veteran care

## 2014-06-06 ENCOUNTER — Ambulatory Visit: Payer: Non-veteran care

## 2014-06-09 ENCOUNTER — Ambulatory Visit: Payer: Non-veteran care

## 2014-06-10 ENCOUNTER — Ambulatory Visit: Payer: Non-veteran care

## 2014-06-11 ENCOUNTER — Ambulatory Visit: Payer: Non-veteran care

## 2014-06-12 ENCOUNTER — Ambulatory Visit: Payer: Non-veteran care

## 2014-06-13 ENCOUNTER — Ambulatory Visit: Payer: Non-veteran care

## 2014-06-16 ENCOUNTER — Ambulatory Visit: Payer: Non-veteran care

## 2014-06-17 ENCOUNTER — Telehealth: Payer: Self-pay | Admitting: Internal Medicine

## 2014-06-17 ENCOUNTER — Ambulatory Visit: Payer: Non-veteran care

## 2014-06-17 NOTE — Telephone Encounter (Signed)
Needs EGDE for clips and brachtherapy catheter placement  Dr. Sondra Come cell  [phone]

## 2014-06-18 ENCOUNTER — Ambulatory Visit: Payer: Non-veteran care

## 2014-06-26 ENCOUNTER — Ambulatory Visit: Payer: Medicare Other | Admitting: Internal Medicine

## 2014-06-26 ENCOUNTER — Other Ambulatory Visit: Payer: Self-pay

## 2014-06-26 DIAGNOSIS — C159 Malignant neoplasm of esophagus, unspecified: Secondary | ICD-10-CM

## 2014-06-26 NOTE — Telephone Encounter (Signed)
Left message for patient to call back  

## 2014-06-26 NOTE — Telephone Encounter (Signed)
I have scheduled EGD with fluoro for 9 A 11/11 at [facility]  Please send orders, contact patient, etc  Dr. Sondra Come is aware

## 2014-06-26 NOTE — Telephone Encounter (Signed)
I spoke with the patient's wife she verbalized understanding of instructions.

## 2014-06-27 ENCOUNTER — Telehealth: Payer: Self-pay | Admitting: Dietician

## 2014-06-27 NOTE — Telephone Encounter (Signed)
Brief Outpatient Oncology Nutrition Note  Patient has been identified to be at risk on malnutrition screen.  Wt Readings from Last 10 Encounters:  06/03/14 183 lb 14.4 oz (83.416 kg)  05/27/14 183 lb 12.8 oz (83.371 kg)  05/23/14 187 lb 11.2 oz (85.14 kg)  05/20/14 190 lb 6.4 oz (86.365 kg)  04/16/14 194 lb 3.2 oz (88.089 kg)  01/30/14 196 lb 1.6 oz (88.95 kg)  11/28/13 200 lb 12.8 oz (91.082 kg)  05/23/13 191 lb 9.6 oz (86.909 kg)  01/29/13 196 lb 6.4 oz (89.086 kg)  11/08/12 199 lb 8 oz (90.493 kg)   Dx:  Esophageal Cancer  Called patient due to weight loss.  Patient states that he is eating well.  States that he has a feeding tube but rarely has to use it.  Followed by RD at [facility] who takes care of feedings and total needs.  Patient is not concerned with weight loss.  RD encouraged increased calories and regular meal/snack intake.  Patient reports no pain or discomfort with swallowing at this time.  Recommend continued tx by RD at [facility].  The [place] RD here is also available.  Patient has met her with past treatments and is aware of how to get in touch with her.  Antonieta Iba, RD, LDN

## 2014-07-02 ENCOUNTER — Ambulatory Visit (HOSPITAL_COMMUNITY)
Admission: RE | Admit: 2014-07-02 | Discharge: 2014-07-02 | Disposition: A | Discharge status: Home / Self Care | Payer: Non-veteran care | Source: Ambulatory Visit | Attending: Internal Medicine | Admitting: Internal Medicine

## 2014-07-02 ENCOUNTER — Ambulatory Visit: Payer: Medicare Other | Admitting: Radiation Oncology

## 2014-07-02 ENCOUNTER — Inpatient Hospital Stay: Admission: RE | Admit: 2014-07-02 | Payer: Self-pay | Source: Ambulatory Visit | Admitting: Radiation Oncology

## 2014-07-02 ENCOUNTER — Ambulatory Visit (HOSPITAL_COMMUNITY): Payer: Non-veteran care

## 2014-07-02 ENCOUNTER — Encounter (HOSPITAL_COMMUNITY)
Admission: RE | Disposition: A | Discharge status: Home / Self Care | Payer: Self-pay | Source: Ambulatory Visit | Attending: Internal Medicine

## 2014-07-02 ENCOUNTER — Encounter (HOSPITAL_COMMUNITY): Payer: Self-pay | Admitting: *Deleted

## 2014-07-02 DIAGNOSIS — G8929 Other chronic pain: Secondary | ICD-10-CM | POA: Diagnosis not present

## 2014-07-02 DIAGNOSIS — I509 Heart failure, unspecified: Secondary | ICD-10-CM | POA: Insufficient documentation

## 2014-07-02 DIAGNOSIS — M4802 Spinal stenosis, cervical region: Secondary | ICD-10-CM | POA: Diagnosis not present

## 2014-07-02 DIAGNOSIS — W881XXA Exposure to radioactive isotopes, initial encounter: Secondary | ICD-10-CM | POA: Diagnosis not present

## 2014-07-02 DIAGNOSIS — E039 Hypothyroidism, unspecified: Secondary | ICD-10-CM | POA: Insufficient documentation

## 2014-07-02 DIAGNOSIS — M549 Dorsalgia, unspecified: Secondary | ICD-10-CM | POA: Diagnosis not present

## 2014-07-02 DIAGNOSIS — Z87891 Personal history of nicotine dependence: Secondary | ICD-10-CM | POA: Insufficient documentation

## 2014-07-02 DIAGNOSIS — I10 Essential (primary) hypertension: Secondary | ICD-10-CM | POA: Diagnosis not present

## 2014-07-02 DIAGNOSIS — M4806 Spinal stenosis, lumbar region: Secondary | ICD-10-CM | POA: Insufficient documentation

## 2014-07-02 DIAGNOSIS — C159 Malignant neoplasm of esophagus, unspecified: Secondary | ICD-10-CM | POA: Diagnosis present

## 2014-07-02 DIAGNOSIS — T66XXXA Radiation sickness, unspecified, initial encounter: Secondary | ICD-10-CM | POA: Insufficient documentation

## 2014-07-02 DIAGNOSIS — Z9221 Personal history of antineoplastic chemotherapy: Secondary | ICD-10-CM | POA: Insufficient documentation

## 2014-07-02 DIAGNOSIS — Z888 Allergy status to other drugs, medicaments and biological substances status: Secondary | ICD-10-CM | POA: Diagnosis not present

## 2014-07-02 DIAGNOSIS — Z8589 Personal history of malignant neoplasm of other organs and systems: Secondary | ICD-10-CM | POA: Diagnosis not present

## 2014-07-02 DIAGNOSIS — N400 Enlarged prostate without lower urinary tract symptoms: Secondary | ICD-10-CM | POA: Diagnosis not present

## 2014-07-02 DIAGNOSIS — K208 Other esophagitis: Secondary | ICD-10-CM | POA: Insufficient documentation

## 2014-07-02 HISTORY — PX: ESOPHAGOGASTRODUODENOSCOPY: SHX5428

## 2014-07-02 SURGERY — EGD (ESOPHAGOGASTRODUODENOSCOPY)
Anesthesia: Moderate Sedation

## 2014-07-02 MED ORDER — SODIUM CHLORIDE 0.9 % IV SOLN: INTRAVENOUS | Status: DC

## 2014-07-02 MED ORDER — DIPHENHYDRAMINE HCL 50 MG/ML IJ SOLN
INTRAMUSCULAR | Status: DC | PRN
  Administered 2014-07-02: 25 mg via INTRAVENOUS

## 2014-07-02 MED ORDER — MIDAZOLAM HCL 10 MG/2ML IJ SOLN
INTRAMUSCULAR | Status: AC
  Filled 2014-07-02: qty 2

## 2014-07-02 MED ORDER — FENTANYL CITRATE 0.05 MG/ML IJ SOLN
INTRAMUSCULAR | Status: DC | PRN
  Administered 2014-07-02 (×5): 25 ug via INTRAVENOUS

## 2014-07-02 MED ORDER — MIDAZOLAM HCL 10 MG/2ML IJ SOLN
INTRAMUSCULAR | Status: DC | PRN
  Administered 2014-07-02 (×2): 2 mg via INTRAVENOUS
  Administered 2014-07-02: 1 mg via INTRAVENOUS
  Administered 2014-07-02: 2 mg via INTRAVENOUS
  Administered 2014-07-02: 1 mg via INTRAVENOUS
  Administered 2014-07-02 (×2): 2 mg via INTRAVENOUS

## 2014-07-02 MED ORDER — FENTANYL CITRATE 0.05 MG/ML IJ SOLN
INTRAMUSCULAR | Status: AC
  Filled 2014-07-02: qty 2

## 2014-07-02 MED ORDER — BUTAMBEN-TETRACAINE-BENZOCAINE 2-2-14 % EX AERO
INHALATION_SPRAY | CUTANEOUS | Status: DC | PRN
  Administered 2014-07-02: 2 via TOPICAL

## 2014-07-02 MED ORDER — GLYCOPYRROLATE 0.2 MG/ML IJ SOLN
INTRAMUSCULAR | Status: AC
  Filled 2014-07-02: qty 1

## 2014-07-02 MED ORDER — DIPHENHYDRAMINE HCL 50 MG/ML IJ SOLN
INTRAMUSCULAR | Status: AC
  Filled 2014-07-02: qty 1

## 2014-07-02 MED ORDER — GLYCOPYRROLATE 0.2 MG/ML IJ SOLN
INTRAMUSCULAR | Status: DC | PRN
  Administered 2014-07-02: 0.2 mg via INTRAVENOUS

## 2014-07-02 NOTE — Discharge Instructions (Signed)
° °  Unfortunately we were unable to place the tube so the brachytherapy will not be possible, it seems. I am going to speak to other experts (Dr. Newman Pies, for one) to see what else might be possible.  I appreciate the opportunity to care for you. Gatha Mayer, MD, FACG  YOU HAD AN ENDOSCOPIC PROCEDURE TODAY: Refer to the procedure report and other information in the discharge instructions given to you for any specific questions about what was found during the examination. If this information does not answer your questions, please call Dr. Celesta Aver office at [phone] to clarify.   YOU SHOULD EXPECT: Some feelings of bloating in the abdomen. Passage of more gas than usual. Walking can help get rid of the air that was put into your GI tract during the procedure and reduce the bloating. If you had a lower endoscopy (such as a colonoscopy or flexible sigmoidoscopy) you may notice spotting of blood in your stool or on the toilet paper. Some abdominal soreness may be present for a day or two, also.  DIET: Your first meal following the procedure should be a light meal and then it is ok to progress to your normal diet. A half-sandwich or bowl of soup is an example of a good first meal. Heavy or fried foods are harder to digest and may make you feel nauseous or bloated. Drink plenty of fluids but you should avoid alcoholic beverages for 24 hours.   ACTIVITY: Your care partner should take you home directly after the procedure. You should plan to take it easy, moving slowly for the rest of the day. You can resume normal activity the day after the procedure however YOU SHOULD NOT DRIVE, use power tools, machinery or perform tasks that involve climbing or major physical exertion for 24 hours (because of the sedation medicines used during the test).   SYMPTOMS TO REPORT IMMEDIATELY: A gastroenterologist can be reached at any hour. Please call [phone]  for any of the following symptoms:  Following  upper endoscopy (EGD, EUS, ERCP, esophageal dilation) Vomiting of blood or coffee ground material  New, significant abdominal pain  New, significant chest pain or pain under the shoulder blades  Painful or persistently difficult swallowing  New shortness of breath  Black, tarry-looking or red, bloody stools  FOLLOW UP:  If any biopsies were taken you will be contacted by phone or by letter within the next 1-3 weeks. Call [phone]  if you have not heard about the biopsies in 3 weeks.  Please also call with any specific questions about appointments or follow up tests.

## 2014-07-02 NOTE — H&P (Signed)
[facility] History and Physical   Primary Care Physician:  Windy Fast, MD   Reason for Procedure:   place brachytherapy tube to treat esophageal cancer  Plan:    Upper endoscopy and clip marking of esophageal cancer, place tube thro mouth or nose using wire. The risks and benefits as well as alternatives of endoscopic procedure(s) have been discussed and reviewed. All questions answered. The patient agrees to proceed.      HPI: Adam Chambers. is a 73 y.o. male with cervical esophageal cancer that needs placement of a special tube into esophagus to treat esophageal cancer with brachytherapy.   Past Medical History  Diagnosis Date  . 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 Hx of radiation therapy 5/9/211-02/09/2010    rad tx tonsillar region   . History of chemotherapy 12/28/09 & then again 02/02/10 & 6/21/211    1 cycle cisplatin, then taxol & carboplatin  . Spinal stenosis     cervical and lumbar  . Blood transfusion   . CHF (congestive heart failure)   . Hepatitis 1995    food?  . BPH (benign prostatic hypertrophy)   . Chronic back pain   . Allergic rhinitis   . Otitis media     left eye  . Hypertension   . 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 Hypothyroid 02/02/2012  . Hypothyroid 02/02/2012  . Blurred vision, left eye   . Esophageal cancer 03/2014    invasive squamous cell  . Squamous cell esophageal cancer 03/2014    invasive    Past Surgical History  Procedure Laterality Date  . Cervical fusion    . Refractive surgery      left eye  . Gastrostomy tube placement      for nutrition /hydration , s/p mucositis assoc with odynophagia&dysphasia  . Tonsillectomy    . Back surgery      1992  . Esophagogastroduodenoscopy  03/21/14  . Esophagogastroduodenoscopy N/A 05/05/2014    Procedure: ESOPHAGOGASTRODUODENOSCOPY (EGD);  Surgeon: Gatha Mayer, MD;  Location: [facility];  Service: Endoscopy;   Laterality: N/A;    Prior to Admission medications   Medication Sig Start Date End Date Taking? Authorizing Provider  amLODipine (NORVASC) 5 MG tablet Take 10 mg by mouth daily.    Yes Historical Provider, MD  benazepril (LOTENSIN) 20 MG tablet Take 20 mg by mouth daily.   Yes Historical Provider, MD  diazepam (VALIUM) 5 MG tablet Take 5 mg by mouth every 6 (six) hours as needed.     Yes Historical Provider, MD  doxazosin (CARDURA) 8 MG tablet Take 8 mg by mouth daily.     Yes Historical Provider, MD  flunisolide (NASAREL) 29 MCG/ACT (0.025%) nasal spray Place 2 sprays into the nose daily as needed. Dose is for each nostril.    Yes Historical Provider, MD  levothyroxine (SYNTHROID) 75 MCG tablet Take 1 tablet (75 mcg total) by mouth daily before breakfast. 11/19/13  Yes Heath Lark, MD  metoprolol (TOPROL-XL) 200 MG 24 hr tablet Take 200 mg by mouth daily. Take 1/2 tab daily   Yes Historical Provider, MD  oxyCODONE (ROXICODONE) 5 MG/5ML solution Take 5 mLs (5 mg total) by mouth every 4 (four) hours as needed for severe pain. 06/02/14  Yes Blair Promise, MD  oxyCODONE-acetaminophen (PERCOCET/ROXICET) 5-325 MG per tablet Take 1-2 tablets by mouth every 6 (six) hours as needed for severe pain. 12/08/13  Yes Janne Napoleon, NP  senna (SENOKOT) 8.6 MG TABS Take 1 tablet by mouth daily as needed.     Yes Historical Provider, MD  sildenafil (VIAGRA) 100 MG tablet Take 100 mg by mouth daily as needed for erectile dysfunction. Takes 1/2 tablet   Yes Historical Provider, MD    No current facility-administered medications for this encounter.    Allergies as of 06/26/2014 - Review Complete 06/03/2014  Allergen Reaction Noted  . Simvastatin Nausea And Vomiting 07/01/2011    Family History  Problem Relation Age of Onset  . Lung cancer Sister   . Diabetes Brother     History   Social History  . Marital Status: Married    Spouse Name: N/A    Number of Children: N/A  . Years of Education: N/A    Occupational History  . Not on file.   Social History Main Topics  . Smoking status: Former Smoker -- 2.00 packs/day for 34 years    Types: Cigarettes    Quit date: 06/30/1993  . Smokeless tobacco: Never Used  . Alcohol Use: No  . Drug Use: No  . Sexual Activity: Not on file   Other Topics Concern  . Not on file   Social History Narrative    Review of Systems: Positive for eyeglassesAll other review of systems negative except as mentioned in the HPI.  Physical Exam: Vital signs in last 24 hours: Temp:  [97.4 F (36.3 C)] 97.4 F (36.3 C) (11/11 0810) Pulse Rate:  [50] 50 (11/11 0810) Resp:  [16] 16 (11/11 0810) BP: (179)/(81) 179/81 mmHg (11/11 0810) SpO2:  [100 %] 100 % (11/11 0810) Weight:  [185 lb (83.915 kg)] 185 lb (83.915 kg) (11/11 0810)   General:   Alert,  Well-developed, well-nourished, pleasant and cooperative in NAD Lungs:  Clear throughout to auscultation.   Heart:  Regular rate and rhythm; no murmurs, clicks, rubs,  or gallops. Abdomen:  Soft, nontender and nondistended. Normal bowel sounds.   Neuro/Psych:  Alert and cooperative. Normal mood and affect. A and O x 3   @Carl  Simonne Maffucci, MD, [facility] [phone] (pager) 07/02/2014 8:51 AM@

## 2014-07-02 NOTE — Op Note (Signed)
[facility], [address]   ENDOSCOPY PROCEDURE REPORT  PATIENT: Adam Chambers, Adam Chambers  MR#: [id number] BIRTHDATE: [birth year] , 14  yrs. old GENDER: male ENDOSCOPIST: Gatha Mayer, MD, [facility] PROCEDURE DATE:  07/02/2014 PROCEDURE:  Esophagoscopy ASA CLASS:     Class III INDICATIONS:  mark esophgaeal cancer and place brachytherapy delivery device. MEDICATIONS: Fentanyl 125 mcg IV and Versed 12 mg IV TOPICAL ANESTHETIC: Cetacaine Spray  DESCRIPTION OF PROCEDURE: After the risks benefits and alternatives of the procedure were thoroughly explained, informed consent was obtained.  The    endoscope was introduced through the mouth and advanced to the esophagus lower , Without limitations.  The instrument was slowly withdrawn as the mucosa was fully examined.    1) Tumor bulge with slight ulcer at 20 cm in cervical esophagus 2) Some XRT esophagitis in the upper esophagus 3) Otherwise NL esophagus 4) Clips placed proximal and sdistal to tumor, proximal clip fell off 5) Unable to place brachytherapy tube - would not pass through nares and we did not think it would be stable placing in oral position - significant sensitivity toworking with scope in this region. Retroflexion was not performed.     The scope was then withdrawn from the patient and the procedure completed.  COMPLICATIONS: There were no immediate complications.  ENDOSCOPIC IMPRESSION: 1) Tumor bulge with slight ulcer at 20 cm in cervical esophagus 2) Some XRT esophagitis in the upper esophagus 3) Otherwise NL esophagus 4) Clips placed proximal and sdistal to tumor, proximal clip fell off 5) Unable to place brachytherapy tube - would not pass through nares and we did not think it would be stable placing in oral position - significant sensitivity toworking with scope in this region  RECOMMENDATIONS: Will see if PDT therapy is an option.  will discuss with Dr.  Newman Pies at  [facility]   eSigned:  Gatha Mayer, MD, [facility] 07/02/2014 11:05 AM

## 2014-07-02 NOTE — Brief Op Note (Signed)
07/02/2014  10:18 AM  PATIENT:  Adam Chambers.  73 y.o. male  PRE-OPERATIVE DIAGNOSIS:  esophgeal cancer  POST-OPERATIVE DIAGNOSIS:  esophageal cancer  PROCEDURE:  Procedure(s) with comments: ESOPHAGOSCAPY - Clips, Nasobiliary Drain, Nasogastric Tube  SURGEON:  Surgeon(s) and Role:    * Gatha Mayer, MD - Primary  ANESTHESIA:   IV sedation   FENTANYL 125 UG iv VERSED 12 MG iv GLYCOPYRROLATE 0.2 MG iv BENADRYL 25 MG iv CETACAINE SPRAY   1) cervical esophageal cancer area seen again at 20 cm from nares 2) some surrounding XRT change 3) Clip placed distal and proximal but proximal clip came off 4) attempts to place brachytherapy tube unsuccessful - could not place through nose

## 2014-07-03 ENCOUNTER — Encounter (HOSPITAL_COMMUNITY): Payer: Self-pay | Admitting: Internal Medicine

## 2014-07-03 ENCOUNTER — Telehealth: Payer: Self-pay | Admitting: Internal Medicine

## 2014-07-03 NOTE — Telephone Encounter (Signed)
Spoke to Dr. Newman Pies and he does not offer any therapy that could help Mr. Bialecki.  He suggested [facility] - Dr. Adria Devon or Dr. Kris Mouton - they do cryotherapy.  PDT is not used anymore it seems.   I updated Mr. Cozart re: possible referral to [facility] He asked me to also call wife - left her a message to call me back after 5 on my cell

## 2014-07-04 NOTE — Telephone Encounter (Signed)
Needs referral to Dr. Valentino Saxon or Dr. Lorelee Market at [facility] re:  Esophageal cancer (squamous) at 20 cm maxed out on XRT  Any local therapy using endoscopy possible?  Let me know

## 2014-07-04 NOTE — Telephone Encounter (Signed)
Referral is sent to [facility] Left message for patient to call back

## 2014-07-07 NOTE — Telephone Encounter (Signed)
Patient's wife [name] notified that referral was initiated and they should hear from [facility] directly to set up appts.  She verbalized understanding.

## 2014-07-09 ENCOUNTER — Ambulatory Visit: Payer: Medicare Other | Admitting: Radiation Oncology

## 2014-07-12 ENCOUNTER — Encounter: Payer: Self-pay | Admitting: Radiation Oncology

## 2014-07-12 NOTE — Progress Notes (Signed)
  Radiation Oncology         [phone]) [id number] ________________________________  Name: Adam Chambers. 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   He did experience some fatigue as well as worsening of his swallowing difficulties during the course of treatment.  The patient had a take place but did not really require any significant use of this device during the course of his radiation treatment  Plan:  The patient has a prior history of radiation therapy to the lower neck region for management of his prior history of tonsillar carcinoma. In light of his prior radiation therapy a full dose of radiation therapy could not be safely given for his cervical esophageal lesion.  On November 11 the patient was taken to the endoscopy suite along with Dr. Leroy Kennedy. Patient did have clips placed along the proximal and distal tumor site but the proximal clip dislodged. Patient had attempted placement of a brachytherapy catheter for high-dose rate radiation therapy directed at the lesion at 20 cm. Due to the location in the upper cervical esophageal area, the patient could not tolerate this catheter and catheter could not be placed through the nares. Through the oral cavity would result in significant coughing making it unreliable procedure. . Patient was being referred to [facility] for consideration for cryotherapy in light of his suboptimal doses of external beam radiation therapy. Routine followup in one  month in radiation oncology -----------------------------------  Blair Promise, PhD, MD

## 2014-07-31 NOTE — Telephone Encounter (Signed)
Patient is scheduled with Gaspar Cola for 08/25/14 2:00.  Appt was arranged with the patient and [facility]

## 2014-08-12 ENCOUNTER — Other Ambulatory Visit: Payer: Self-pay

## 2014-08-12 DIAGNOSIS — C159 Malignant neoplasm of esophagus, unspecified: Secondary | ICD-10-CM

## 2014-10-21 ENCOUNTER — Other Ambulatory Visit: Payer: Self-pay | Admitting: Hematology and Oncology

## 2015-01-05 ENCOUNTER — Encounter: Payer: Self-pay | Admitting: Internal Medicine

## 2015-01-29 ENCOUNTER — Encounter: Payer: Self-pay | Admitting: Internal Medicine

## 2016-06-14 ENCOUNTER — Encounter (HOSPITAL_COMMUNITY): Payer: Self-pay | Admitting: Emergency Medicine

## 2016-06-14 ENCOUNTER — Emergency Department (HOSPITAL_COMMUNITY): Payer: Medicare Other

## 2016-06-14 DIAGNOSIS — I110 Hypertensive heart disease with heart failure: Secondary | ICD-10-CM | POA: Diagnosis not present

## 2016-06-14 DIAGNOSIS — X58XXXA Exposure to other specified factors, initial encounter: Secondary | ICD-10-CM | POA: Insufficient documentation

## 2016-06-14 DIAGNOSIS — Z79899 Other long term (current) drug therapy: Secondary | ICD-10-CM | POA: Diagnosis not present

## 2016-06-14 DIAGNOSIS — E039 Hypothyroidism, unspecified: Secondary | ICD-10-CM | POA: Insufficient documentation

## 2016-06-14 DIAGNOSIS — Y939 Activity, unspecified: Secondary | ICD-10-CM | POA: Diagnosis not present

## 2016-06-14 DIAGNOSIS — T18120A Food in esophagus causing compression of trachea, initial encounter: Secondary | ICD-10-CM | POA: Insufficient documentation

## 2016-06-14 DIAGNOSIS — Z87891 Personal history of nicotine dependence: Secondary | ICD-10-CM | POA: Diagnosis not present

## 2016-06-14 DIAGNOSIS — Y999 Unspecified external cause status: Secondary | ICD-10-CM | POA: Insufficient documentation

## 2016-06-14 DIAGNOSIS — Y929 Unspecified place or not applicable: Secondary | ICD-10-CM | POA: Diagnosis not present

## 2016-06-14 DIAGNOSIS — Z8501 Personal history of malignant neoplasm of esophagus: Secondary | ICD-10-CM | POA: Diagnosis not present

## 2016-06-14 DIAGNOSIS — I509 Heart failure, unspecified: Secondary | ICD-10-CM | POA: Diagnosis not present

## 2016-06-14 NOTE — ED Triage Notes (Signed)
Pt. reports food stuck in his throat while eating chili this afternoon , no emesis , respirations unlabored /airway intact , pt. stated history of esophageal cancer with scar tissue , no oral swelling or stridor.

## 2016-06-15 ENCOUNTER — Emergency Department (HOSPITAL_COMMUNITY)
Admission: EM | Admit: 2016-06-15 | Discharge: 2016-06-15 | Disposition: A | Discharge status: Home / Self Care | Payer: Medicare Other | Attending: Emergency Medicine | Admitting: Emergency Medicine

## 2016-06-15 DIAGNOSIS — T18128A Food in esophagus causing other injury, initial encounter: Secondary | ICD-10-CM

## 2016-06-15 MED ORDER — NITROGLYCERIN 0.4 MG SL SUBL
0.4000 mg | SUBLINGUAL_TABLET | Freq: Once | SUBLINGUAL | Status: AC
  Administered 2016-06-15: 0.4 mg via SUBLINGUAL
  Filled 2016-06-15: qty 1

## 2016-06-15 MED ORDER — METOCLOPRAMIDE HCL 5 MG/ML IJ SOLN
10.0000 mg | Freq: Once | INTRAMUSCULAR | Status: AC
  Administered 2016-06-15: 10 mg via INTRAVENOUS
  Filled 2016-06-15: qty 2

## 2016-06-15 MED ORDER — GLUCAGON HCL RDNA (DIAGNOSTIC) 1 MG IJ SOLR
1.0000 mg | Freq: Once | INTRAMUSCULAR | Status: AC
  Administered 2016-06-15: 1 mg via INTRAVENOUS
  Filled 2016-06-15: qty 1

## 2016-06-15 NOTE — ED Provider Notes (Signed)
[facility] DEPT Provider Note   CSN: [id number] Arrival date & time: 06/14/16  2118  By signing my name below, I, Reola Mosher, attest that this documentation has been prepared under the direction and in the presence of Everlene Balls, MD. Electronically Signed: Reola Mosher, ED Scribe. 06/15/16. 12:31 AM.  History   Chief Complaint Chief Complaint  Patient presents with  . Food stuck in throat   The history is provided by the patient and the spouse. No language interpreter was used.   HPI Comments: Adam Chambers. is a 75 y.o. male who presents to the Emergency Department complaining of sudden onset of sensation of foreign body to the throat onset ~7 hours ago. Pt reports that he was eating chili earlier tonight when he felt a piece of his food dislodge in his throat, sustaining his sensation. Pt additionally reports that he is attempted to drink water and hot coffee to alleviate this issue; however, was unable to tolerate swallowing these liquids and subsequently vomited them back up. Per wife, pt has a a h/o cervical esophogeal cancer with surrounding scar tissue from several prior procedures to the area. No other associated symptoms or complaints.   Past Medical History:  Diagnosis Date  . Allergic rhinitis   . Blood transfusion   . Blurred vision, left eye   . BPH (benign prostatic hypertrophy)   . CHF (congestive heart failure) ([place])   . Chronic back pain   . Esophageal cancer ([place]) 03/2014   invasive squamous cell  . Hepatitis 1995   food?  . History of chemotherapy 12/28/09 & then again 02/02/10 & 6/21/211   1 cycle cisplatin, then taxol & carboplatin  . Hx of radiation therapy 5/9/211-02/09/2010   rad tx tonsillar region   . Hypertension   . Hypothyroid 02/02/2012  . Hypothyroid 02/02/2012  . Otitis media    left eye  . Spinal stenosis    cervical and lumbar  . Squamous cell esophageal cancer ([place]) 03/2014   invasive  . Thyroid disease    secondary to  radiation therapy  . 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 Malignant neoplasm of cervical esophagus ([place]) 04/16/2014  . Xerostomia 01/30/2014  . Dysphagia 01/30/2014  . Elevated serum creatinine 01/30/2014  . Hypothyroid 02/02/2012  . Chronic back pain 07/01/2011  . Tonsil cancer [facility])    Past Surgical History:  Procedure Laterality Date  . BACK SURGERY     1992  . CERVICAL FUSION    . ESOPHAGOGASTRODUODENOSCOPY  03/21/14  . ESOPHAGOGASTRODUODENOSCOPY N/A 05/05/2014   Procedure: ESOPHAGOGASTRODUODENOSCOPY (EGD);  Surgeon: Gatha Mayer, MD;  Location: [facility];  Service: Endoscopy;  Laterality: N/A;  . ESOPHAGOGASTRODUODENOSCOPY N/A 07/02/2014   Procedure: ESOPHAGOGASTRODUODENOSCOPY (EGD);  Surgeon: Gatha Mayer, MD;  Location: [facility];  Service: Endoscopy;  Laterality: N/A;  Clips, Nasobiliary Drain, Nasogastric Tube  . GASTROSTOMY TUBE PLACEMENT     for nutrition /hydration , s/p mucositis assoc with odynophagia&dysphasia  . REFRACTIVE SURGERY     left eye  . TONSILLECTOMY      Home Medications    Prior to Admission medications   Medication Sig Start Date End Date Taking? Authorizing Provider  amLODipine (NORVASC) 5 MG tablet Take 10 mg by mouth daily.    Yes Historical Provider, MD  benazepril (LOTENSIN) 20 MG tablet Take 20 mg by mouth daily.   Yes  Historical Provider, MD  diazepam (VALIUM) 5 MG tablet Take 5 mg by mouth every 6 (six) hours as needed.     Yes Historical Provider, MD  doxazosin (CARDURA) 8 MG tablet Take 8 mg by mouth daily.     Yes Historical Provider, MD  flunisolide (NASAREL) 29 MCG/ACT (0.025%) nasal spray Place 2 sprays into the nose daily as needed. Dose is for each nostril.    Yes Historical Provider, MD  levothyroxine (SYNTHROID) 75 MCG tablet Take 1 tablet (75 mcg total) by mouth daily before breakfast. 11/19/13  Yes Heath Lark, MD  metoprolol  (TOPROL-XL) 200 MG 24 hr tablet Take 200 mg by mouth daily. Take 1/2 tab daily   Yes Historical Provider, MD  oxyCODONE (ROXICODONE) 5 MG/5ML solution Take 5 mLs (5 mg total) by mouth every 4 (four) hours as needed for severe pain. 06/02/14  Yes Gery Pray, MD  oxyCODONE-acetaminophen (PERCOCET/ROXICET) 5-325 MG per tablet Take 1-2 tablets by mouth every 6 (six) hours as needed for severe pain. 12/08/13  Yes Janne Napoleon, NP  senna (SENOKOT) 8.6 MG TABS Take 1 tablet by mouth daily as needed for mild constipation.    Yes Historical Provider, MD  sildenafil (VIAGRA) 100 MG tablet Take 100 mg by mouth daily as needed for erectile dysfunction. Takes 1/2 tablet   Yes Historical Provider, MD   Family History Family History  Problem Relation Age of Onset  . Lung cancer Sister   . Diabetes Brother    Social History Social History  Substance Use Topics  . Smoking status: Former Smoker    Packs/day: 2.00    Years: 34.00    Types: Cigarettes    Quit date: 06/30/1993  . Smokeless tobacco: Never Used  . Alcohol use No   Allergies   Simvastatin  Review of Systems Review of Systems A complete 10 system review of systems was obtained and all systems are negative except as noted in the HPI and PMH.   Physical Exam Updated Vital Signs BP 175/89 (BP Location: Left Arm)   Pulse (!) 58   Temp 98.5 F (36.9 C) (Oral)   Resp 18   Ht 6\' 4"  (1.93 m)   Wt 195 lb (88.5 kg)   SpO2 99%   BMI 23.74 kg/m   Physical Exam  Constitutional: He is oriented to person, place, and time. Vital signs are normal. He appears well-developed and well-nourished.  Non-toxic appearance. He does not appear ill. No distress.  HENT:  Head: Normocephalic and atraumatic.  Nose: Nose normal.  Mouth/Throat: Oropharynx is clear and moist. No oropharyngeal exudate.  Muffled voice noted. No impaction seen in the posterior oropharynx.   Eyes: Conjunctivae and EOM are normal. Pupils are equal, round, and reactive to light. No  scleral icterus.  Neck: Normal range of motion. Neck supple. No tracheal deviation, no edema, no erythema and normal range of motion present. No thyroid mass and no thyromegaly present.  Cardiovascular: Normal rate, regular rhythm, S1 normal, S2 normal, normal heart sounds, intact distal pulses and normal pulses.  Exam reveals no gallop and no friction rub.   No murmur heard. Pulmonary/Chest: Effort normal and breath sounds normal. No respiratory distress. He has no wheezes. He has no rhonchi. He has no rales.  Abdominal: Soft. Normal appearance and bowel sounds are normal. He exhibits no distension, no ascites and no mass. There is no hepatosplenomegaly. There is no tenderness. There is no rebound, no guarding and no CVA tenderness.  Musculoskeletal: Normal range of  motion. He exhibits no edema or tenderness.  Lymphadenopathy:    He has no cervical adenopathy.  Neurological: He is alert and oriented to person, place, and time. He has normal strength. No cranial nerve deficit or sensory deficit.  Skin: Skin is warm, dry and intact. No petechiae and no rash noted. He is not diaphoretic. No erythema. No pallor.  Nursing note and vitals reviewed.  ED Treatments / Results  DIAGNOSTIC STUDIES: Oxygen Saturation is 99% on RA, normal by my interpretation.   COORDINATION OF CARE: 12:31 AM-Discussed next steps with pt. Pt verbalized understanding and is agreeable with the plan.   Labs (all labs ordered are listed, but only abnormal results are displayed) Labs Reviewed - No data to display  EKG  EKG Interpretation None      Radiology Dg Neck Soft Tissue  Result Date: 06/14/2016 CLINICAL DATA:  Food stuck in throat since 5 p.m. Discussion. Patient undergoing treatment for esophageal cancer. EXAM: NECK SOFT TISSUES - 1+ VIEW COMPARISON:  05/05/2014 FINDINGS: There is no prevertebral soft tissue swelling. The nasopharynx, oropharynx, pharynx as well as hypopharynx are unremarkable. No  radiopaque foreign body is identified. The epiglottis is normal. The hyoid bone and cricoid cartilage are unremarkable. No soft tissue emphysema. Nuchal ligament ossification posterior to the C5 spinous process. Anterior cervical disc fusion with bony incorporation noted from C3 through C7. No hardware failure. IMPRESSION: No radiopaque foreign body identified. Anterior cervical disc fusion C3 through C7. Electronically Signed   By: Ashley Royalty M.D.   On: 06/14/2016 22:24   Procedures Procedures   Medications Ordered in ED Medications - No data to display  Initial Impression / Assessment and Plan / ED Course  I have reviewed the triage vital signs and the nursing notes.  Pertinent labs & imaging results that were available during my care of the patient were reviewed by me and considered in my medical decision making (see chart for details).  Clinical Course   Patient presents to the ED for food bolus after eating chili.  He states something is still stuck. He was given nitro, reglan, and glucagon for relief.  He also drank 2 sodas in the ED.  1:37 AM Patient had the urge to burp and a chili bean came up.  He states he now feels back to normal and at his baseline.  He appears well and in NAD. VS remain within his normal limits and he is safe for DC.  Final Clinical Impressions(s) / ED Diagnoses   Final diagnoses:  None   New Prescriptions New Prescriptions   No medications on file     I personally performed the services described in this documentation, which was scribed in my presence. The recorded information has been reviewed and is accurate.      Everlene Balls, MD 06/15/16 [phone]

## 2016-06-15 NOTE — ED Notes (Signed)
MD at bedside. 

## 2016-08-26 DIAGNOSIS — E46 Unspecified protein-calorie malnutrition: Secondary | ICD-10-CM | POA: Diagnosis present

## 2016-08-26 DIAGNOSIS — Z930 Tracheostomy status: Secondary | ICD-10-CM

## 2016-10-26 ENCOUNTER — Telehealth: Payer: Self-pay | Admitting: Hematology and Oncology

## 2016-10-26 NOTE — Telephone Encounter (Signed)
FAXED RECORDS TO [facility] RELEASE ID [id number]

## 2016-11-01 ENCOUNTER — Telehealth: Payer: Self-pay | Admitting: *Deleted

## 2016-11-01 NOTE — Telephone Encounter (Signed)
On 11-01-16 fax medical records to [facility] [place]

## 2017-03-17 ENCOUNTER — Inpatient Hospital Stay (HOSPITAL_COMMUNITY)
Admission: EM | Admit: 2017-03-17 | Discharge: 2017-03-27 | DRG: 177 | Disposition: A | Discharge status: Home Health | Payer: Non-veteran care | Attending: Internal Medicine | Admitting: Internal Medicine

## 2017-03-17 ENCOUNTER — Emergency Department (HOSPITAL_COMMUNITY): Payer: Non-veteran care

## 2017-03-17 DIAGNOSIS — E871 Hypo-osmolality and hyponatremia: Secondary | ICD-10-CM | POA: Diagnosis present

## 2017-03-17 DIAGNOSIS — R4182 Altered mental status, unspecified: Secondary | ICD-10-CM | POA: Diagnosis present

## 2017-03-17 DIAGNOSIS — M549 Dorsalgia, unspecified: Secondary | ICD-10-CM | POA: Diagnosis present

## 2017-03-17 DIAGNOSIS — G8929 Other chronic pain: Secondary | ICD-10-CM | POA: Diagnosis present

## 2017-03-17 DIAGNOSIS — G9341 Metabolic encephalopathy: Secondary | ICD-10-CM | POA: Diagnosis present

## 2017-03-17 DIAGNOSIS — F419 Anxiety disorder, unspecified: Secondary | ICD-10-CM | POA: Diagnosis present

## 2017-03-17 DIAGNOSIS — Z930 Tracheostomy status: Secondary | ICD-10-CM

## 2017-03-17 DIAGNOSIS — T17908A Unspecified foreign body in respiratory tract, part unspecified causing other injury, initial encounter: Secondary | ICD-10-CM

## 2017-03-17 DIAGNOSIS — E873 Alkalosis: Secondary | ICD-10-CM | POA: Diagnosis present

## 2017-03-17 DIAGNOSIS — I10 Essential (primary) hypertension: Secondary | ICD-10-CM | POA: Diagnosis present

## 2017-03-17 DIAGNOSIS — T8183XA Persistent postprocedural fistula, initial encounter: Secondary | ICD-10-CM

## 2017-03-17 DIAGNOSIS — Z981 Arthrodesis status: Secondary | ICD-10-CM

## 2017-03-17 DIAGNOSIS — C099 Malignant neoplasm of tonsil, unspecified: Secondary | ICD-10-CM | POA: Diagnosis present

## 2017-03-17 DIAGNOSIS — T8130XA Disruption of wound, unspecified, initial encounter: Secondary | ICD-10-CM | POA: Diagnosis present

## 2017-03-17 DIAGNOSIS — Z681 Body mass index (BMI) 19 or less, adult: Secondary | ICD-10-CM

## 2017-03-17 DIAGNOSIS — K911 Postgastric surgery syndromes: Secondary | ICD-10-CM | POA: Diagnosis present

## 2017-03-17 DIAGNOSIS — D509 Iron deficiency anemia, unspecified: Secondary | ICD-10-CM | POA: Diagnosis present

## 2017-03-17 DIAGNOSIS — E43 Unspecified severe protein-calorie malnutrition: Secondary | ICD-10-CM | POA: Diagnosis present

## 2017-03-17 DIAGNOSIS — T8131XA Disruption of external operation (surgical) wound, not elsewhere classified, initial encounter: Secondary | ICD-10-CM | POA: Diagnosis present

## 2017-03-17 DIAGNOSIS — K567 Ileus, unspecified: Secondary | ICD-10-CM

## 2017-03-17 DIAGNOSIS — J690 Pneumonitis due to inhalation of food and vomit: Secondary | ICD-10-CM | POA: Diagnosis not present

## 2017-03-17 DIAGNOSIS — Y838 Other surgical procedures as the cause of abnormal reaction of the patient, or of later complication, without mention of misadventure at the time of the procedure: Secondary | ICD-10-CM | POA: Diagnosis present

## 2017-03-17 DIAGNOSIS — Z923 Personal history of irradiation: Secondary | ICD-10-CM

## 2017-03-17 DIAGNOSIS — E46 Unspecified protein-calorie malnutrition: Secondary | ICD-10-CM | POA: Diagnosis present

## 2017-03-17 DIAGNOSIS — J189 Pneumonia, unspecified organism: Secondary | ICD-10-CM

## 2017-03-17 DIAGNOSIS — N390 Urinary tract infection, site not specified: Secondary | ICD-10-CM | POA: Diagnosis not present

## 2017-03-17 DIAGNOSIS — E785 Hyperlipidemia, unspecified: Secondary | ICD-10-CM | POA: Diagnosis present

## 2017-03-17 DIAGNOSIS — Z8501 Personal history of malignant neoplasm of esophagus: Secondary | ICD-10-CM

## 2017-03-17 DIAGNOSIS — F05 Delirium due to known physiological condition: Secondary | ICD-10-CM

## 2017-03-17 DIAGNOSIS — Z801 Family history of malignant neoplasm of trachea, bronchus and lung: Secondary | ICD-10-CM

## 2017-03-17 DIAGNOSIS — F4321 Adjustment disorder with depressed mood: Secondary | ICD-10-CM | POA: Diagnosis present

## 2017-03-17 DIAGNOSIS — N179 Acute kidney failure, unspecified: Secondary | ICD-10-CM | POA: Diagnosis present

## 2017-03-17 DIAGNOSIS — F322 Major depressive disorder, single episode, severe without psychotic features: Secondary | ICD-10-CM

## 2017-03-17 DIAGNOSIS — N400 Enlarged prostate without lower urinary tract symptoms: Secondary | ICD-10-CM | POA: Diagnosis present

## 2017-03-17 DIAGNOSIS — R404 Transient alteration of awareness: Secondary | ICD-10-CM

## 2017-03-17 DIAGNOSIS — Z79899 Other long term (current) drug therapy: Secondary | ICD-10-CM

## 2017-03-17 DIAGNOSIS — E861 Hypovolemia: Secondary | ICD-10-CM | POA: Diagnosis not present

## 2017-03-17 DIAGNOSIS — E039 Hypothyroidism, unspecified: Secondary | ICD-10-CM | POA: Diagnosis present

## 2017-03-17 DIAGNOSIS — G4700 Insomnia, unspecified: Secondary | ICD-10-CM | POA: Diagnosis present

## 2017-03-17 DIAGNOSIS — Z87891 Personal history of nicotine dependence: Secondary | ICD-10-CM

## 2017-03-17 DIAGNOSIS — K117 Disturbances of salivary secretion: Secondary | ICD-10-CM

## 2017-03-17 DIAGNOSIS — Z9221 Personal history of antineoplastic chemotherapy: Secondary | ICD-10-CM

## 2017-03-17 DIAGNOSIS — E876 Hypokalemia: Secondary | ICD-10-CM | POA: Diagnosis present

## 2017-03-17 DIAGNOSIS — Z888 Allergy status to other drugs, medicaments and biological substances status: Secondary | ICD-10-CM

## 2017-03-17 DIAGNOSIS — Z8701 Personal history of pneumonia (recurrent): Secondary | ICD-10-CM

## 2017-03-17 DIAGNOSIS — Z833 Family history of diabetes mellitus: Secondary | ICD-10-CM

## 2017-03-17 DIAGNOSIS — I959 Hypotension, unspecified: Secondary | ICD-10-CM | POA: Diagnosis not present

## 2017-03-17 DIAGNOSIS — Y95 Nosocomial condition: Secondary | ICD-10-CM | POA: Diagnosis present

## 2017-03-17 LAB — I-STAT ARTERIAL BLOOD GAS, ED
ACID-BASE EXCESS: 10 mmol/L — AB (ref 0.0–2.0)
BICARBONATE: 34.2 mmol/L — AB (ref 20.0–28.0)
O2 Saturation: 98 %
PCO2 ART: 43.5 mmHg (ref 32.0–48.0)
PO2 ART: 96 mmHg (ref 83.0–108.0)
Patient temperature: 98.9
TCO2: 35 mmol/L (ref 0–100)
pH, Arterial: 7.503 — ABNORMAL HIGH (ref 7.350–7.450)

## 2017-03-17 LAB — CBC WITH DIFFERENTIAL/PLATELET
Basophils Absolute: 0 10*3/uL (ref 0.0–0.1)
Basophils Relative: 0 %
EOS PCT: 3 %
Eosinophils Absolute: 0.2 10*3/uL (ref 0.0–0.7)
HEMATOCRIT: 29.9 % — AB (ref 39.0–52.0)
Hemoglobin: 9.8 g/dL — ABNORMAL LOW (ref 13.0–17.0)
LYMPHS ABS: 0.5 10*3/uL — AB (ref 0.7–4.0)
LYMPHS PCT: 7 %
MCH: 24.1 pg — AB (ref 26.0–34.0)
MCHC: 32.8 g/dL (ref 30.0–36.0)
MCV: 73.5 fL — AB (ref 78.0–100.0)
Monocytes Absolute: 0.4 10*3/uL (ref 0.1–1.0)
Monocytes Relative: 7 %
NEUTROS ABS: 5.6 10*3/uL (ref 1.7–7.7)
Neutrophils Relative %: 83 %
PLATELETS: 431 10*3/uL — AB (ref 150–400)
RBC: 4.07 MIL/uL — AB (ref 4.22–5.81)
RDW: 17.5 % — ABNORMAL HIGH (ref 11.5–15.5)
WBC: 6.6 10*3/uL (ref 4.0–10.5)

## 2017-03-17 LAB — COMPREHENSIVE METABOLIC PANEL
ALBUMIN: 2.3 g/dL — AB (ref 3.5–5.0)
ALT: 7 U/L — ABNORMAL LOW (ref 17–63)
AST: 13 U/L — AB (ref 15–41)
Alkaline Phosphatase: 47 U/L (ref 38–126)
Anion gap: 10 (ref 5–15)
BUN: 13 mg/dL (ref 6–20)
CHLORIDE: 85 mmol/L — AB (ref 101–111)
CO2: 31 mmol/L (ref 22–32)
Calcium: 8.3 mg/dL — ABNORMAL LOW (ref 8.9–10.3)
Creatinine, Ser: 1.34 mg/dL — ABNORMAL HIGH (ref 0.61–1.24)
GFR calc Af Amer: 58 mL/min — ABNORMAL LOW (ref >60)
GFR calc non Af Amer: 50 mL/min — ABNORMAL LOW (ref >60)
GLUCOSE: 82 mg/dL (ref 65–99)
POTASSIUM: 3.4 mmol/L — AB (ref 3.5–5.1)
Sodium: 126 mmol/L — ABNORMAL LOW (ref 135–145)
Total Bilirubin: 0.8 mg/dL (ref 0.3–1.2)
Total Protein: 6.1 g/dL — ABNORMAL LOW (ref 6.5–8.1)

## 2017-03-17 LAB — I-STAT CG4 LACTIC ACID, ED
LACTIC ACID, VENOUS: 0.97 mmol/L (ref 0.5–1.9)
LACTIC ACID, VENOUS: 0.84 mmol/L (ref 0.5–1.9)

## 2017-03-17 LAB — I-STAT TROPONIN, ED: Troponin i, poc: 0.01 ng/mL (ref 0.00–0.08)

## 2017-03-17 MED ORDER — VANCOMYCIN HCL IN DEXTROSE 1-5 GM/200ML-% IV SOLN
1000.0000 mg | Freq: Once | INTRAVENOUS | Status: AC
  Administered 2017-03-17: 1000 mg via INTRAVENOUS
  Filled 2017-03-17: qty 200

## 2017-03-17 MED ORDER — VANCOMYCIN HCL IN DEXTROSE 1-5 GM/200ML-% IV SOLN: 1000.0000 mg | INTRAVENOUS | Status: DC

## 2017-03-17 MED ORDER — SODIUM CHLORIDE 0.9 % IV SOLN
Freq: Once | INTRAVENOUS | Status: AC
  Administered 2017-03-17: 20:00:00 via INTRAVENOUS

## 2017-03-17 MED ORDER — PIPERACILLIN-TAZOBACTAM 3.375 G IVPB
3.3750 g | Freq: Three times a day (TID) | INTRAVENOUS | Status: DC
Start: 2017-03-18 — End: 2017-03-18

## 2017-03-17 MED ORDER — PIPERACILLIN-TAZOBACTAM 3.375 G IVPB 30 MIN
3.3750 g | Freq: Once | INTRAVENOUS | Status: AC
  Administered 2017-03-17: 3.375 g via INTRAVENOUS
  Filled 2017-03-17: qty 50

## 2017-03-17 MED ORDER — VANCOMYCIN HCL IN DEXTROSE 750-5 MG/150ML-% IV SOLN
750.0000 mg | Freq: Two times a day (BID) | INTRAVENOUS | Status: DC
Start: 2017-03-18 — End: 2017-03-17

## 2017-03-17 NOTE — ED Notes (Signed)
Patient transported to CT 

## 2017-03-17 NOTE — H&P (Signed)
Date: 03/17/2017               Patient Name:  Adam Chambers. MRN: [id number]  DOB: July 31, 1941 Age / Sex: 76 y.o., male   PCP: Windy Fast, MD         Medical Service: Internal Medicine Teaching Service         Attending Physician: Dr. Sid Falcon, MD    First Contact: Dr. Maricela Bo  Pager: [phone]  Second Contact: Dr. Benjamine Mola Pager: [phone]       After Hours (After 5p/  First Contact Pager: [phone]  weekends / holidays): Second Contact Pager: [phone]   Chief Complaint: Altered mental status   History of Present Illness: Adam Chambers, Adam Chambers. is a 76 y.o. male with history of esophageal cancer s/p laryngopharyngectomy + chemo and radiation, CHF, HTN, hypothyroidism, BPH, chronic neck and back pain who presents to the ED with AMS that started this morning. Patient minimally verbal and history provided by wife and daughter who were present in the room. Patient is able to ambulate and voices needs with the use of a larynx speech aid at baseline.  Patient was in his usual state of health and at baseline this AM when waking up. He stayed in bed most of the AM, though wife says she thinks she heard him get up to use the commode a couple of times. When she went to check him later in the day she found him unresponsive. He was alert and oriented when seen and stated "i just feel weak." He also endorsed neck, epigastric, and chest pain. Denied SOB, N/V.   Patient recently admitted at OSH in [place] for SOB and found to have PNA that was treated with IV antibiotics. Unclear location of PNA. Per wife, he has been having episodes of diarrhea since finishing levofloxacin 3 weeks ago for PNA. No hematochezia. His last BM was 3 days ago. She has been trying to hydrate him with Pedialyte. She has also noticed increased secretions from trach site that she describes as thick white/yellow sputum. Was told by his doctor that this could be reflux of  residual formula.   ED course: Patient was afebrile and HDS.  Lab work remarkable for hyponatremia 126, hypokalemia 3.4, AKI Cr 1.34, and I-stat ABG 7.5/43.5/96/34.2 consistent with metabolic alkalosis. Head CT negative, and CXR with ?patchy opacities bilaterally and small pleural effusion. Negative troponin and EKG.   Meds:  Current Meds  Medication Sig  . amLODipine (NORVASC) 5 MG tablet Take 10 mg by mouth daily.   . benazepril (LOTENSIN) 20 MG tablet Take 20 mg by mouth daily.  . diazepam (VALIUM) 5 MG tablet Take 5 mg by mouth every 6 (six) hours as needed for anxiety.   Marland Kitchen doxazosin (CARDURA) 8 MG tablet Take 8 mg by mouth daily as needed (for blood pressure).   . flunisolide (NASAREL) 29 MCG/ACT (0.025%) nasal spray Place 2 sprays into the nose daily as needed. Dose is for each nostril.   Marland Kitchen levothyroxine (SYNTHROID, LEVOTHROID) 100 MCG tablet Take 100 mcg by mouth daily before breakfast.  . metoprolol (TOPROL-XL) 200 MG 24 hr tablet Take 100 mg by mouth daily.   Marland Kitchen oxyCODONE (ROXICODONE) 5 MG/5ML solution Take 5 mLs (5 mg total) by mouth every 4 (four) hours as needed for severe pain.  Marland Kitchen oxyCODONE-acetaminophen (PERCOCET/ROXICET) 5-325 MG per tablet Take 1-2 tablets by mouth every 6 (six) hours as needed for severe pain.  . traZODone (DESYREL) 50  MG tablet Take 50 mg by mouth at bedtime.     Allergies: Allergies as of 03/17/2017 - Review Complete 03/17/2017  Allergen Reaction Noted  . Simvastatin Nausea And Vomiting 07/01/2011   Past Medical History:  Diagnosis Date  . Allergic rhinitis   . Blood transfusion   . Blurred vision, left eye   . BPH (benign prostatic hypertrophy)   . CHF (congestive heart failure) ([place])   . Chronic back pain   . Esophageal cancer ([place]) 03/2014   invasive squamous cell  . Hepatitis 1995   food?  . History of chemotherapy 12/28/09 & then again 02/02/10 & 6/21/211   1 cycle cisplatin, then taxol & carboplatin  . Hx of radiation therapy 5/9/211-02/09/2010   rad tx tonsillar region   . Hypertension   . Hypothyroid  02/02/2012  . Hypothyroid 02/02/2012  . Otitis media    left eye  . Spinal stenosis    cervical and lumbar  . Squamous cell esophageal cancer ([place]) 03/2014   invasive  . Thyroid disease    secondary to radiation therapy  . 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 BACK SURGERY     1992  . CERVICAL FUSION    . ESOPHAGOGASTRODUODENOSCOPY  03/21/14  . ESOPHAGOGASTRODUODENOSCOPY N/A 05/05/2014   Procedure: ESOPHAGOGASTRODUODENOSCOPY (EGD);  Surgeon: Gatha Mayer, MD;  Location: [facility];  Service: Endoscopy;  Laterality: N/A;  . ESOPHAGOGASTRODUODENOSCOPY N/A 07/02/2014   Procedure: ESOPHAGOGASTRODUODENOSCOPY (EGD);  Surgeon: Gatha Mayer, MD;  Location: [facility];  Service: Endoscopy;  Laterality: N/A;  Clips, Nasobiliary Drain, Nasogastric Tube  . GASTROSTOMY TUBE PLACEMENT     for nutrition /hydration , s/p mucositis assoc with odynophagia&dysphasia  . REFRACTIVE SURGERY     left eye  . TONSILLECTOMY      Family History:  Family History  Problem Relation Age of Onset  . Lung cancer Sister   . Diabetes Brother    Social History:  Social History   Social History  . Marital status: Married    Spouse name: N/A  . Number of children: N/A  . Years of education: N/A   Social History Main Topics  . Smoking status: Former Smoker    Packs/day: 2.00    Years: 34.00    Types: Cigarettes    Quit date: 06/30/1993  . Smokeless tobacco: Never Used  . Alcohol use No  . Drug use: No  . Sexual activity: Not on file   Other Topics Concern  . Not on file   Social History Narrative  . No narrative on file     Review of Systems: A complete ROS was negative except as per HPI.   Physical Exam: Blood pressure (!) 136/95, pulse 80, temperature 98.9 F (37.2 C), temperature source Oral, resp. rate 18, weight 160 lb 15 oz (73 kg), SpO2 100 %.  General: thin male lying in bed in no  acute distress  HENT: NCAT, neck supple and FROM   Eyes: anicteric sclera, PERRL Cardiac: regular rate and rhythm, nl S1/S2, no murmurs, rubs or gallops  Pulm: unable to appreciate breath sounds bilaterally due to poor patient effort, no increased work of breathing  Abd: soft, NTND, bowel sounds present, G tube noted in LLQ, mid abdomen surgical scar present  Neuro: A&Ox3, able to move all four extremities  Ext: warm and well perfused, no peripheral edema, 2+ DP pulses bilaterally  Derm:  no skin findings  Labs:  CBC WBC 6.6  H/H 9.8/29.9  Plt 431  MCV 73.5 CMP 126  3.4  85  31  13  1.34  82   AST13  ALT 7  Alk phos 47 TBili 0.8  Albumin 2.3  ABG 7.5/43.5/96/34.2  Lactic acid 0.9 I-stat troponin negative  UA pending  Blood cx x2 pending  Sputum cx pending    EKG: personally reviewed my interpretation is sinus rhythm at 81bpm , nl intervals, left axis deviation, Q waves in V1-V2, no ST changes or T wave inversions   CXR: personally reviewed my interpretation is patent airway, trach noted, patchy opacity in periphery of R mid lung, small R pleural effusion, no abnormalities seen on L lung fields  Head CT: IMPRESSION: 1. No acute abnormality. 2. Mild diffuse cerebral and cerebellar atrophy, age appropriate.  Assessment & Plan by Problem:  Adam Hart. is a 76 y.o. male with history of esophageal cancer s/p laryngopharyngectomy + chemo and radiation, CHF, HTN, hypothyroidism, BPH, chronic neck and back pain who presents with acute onset AMS.   # Altered mental status: Seems resolved as patient was alert and oriented when seen. Unclear etiology at this time. Recent history of PNA treated with IV and PO antibiotics 3 weeks ago. CXR today with ?b/l opacities, though it is unclear where he had past PNA and this could also be evidence of resolving PNA. He does not have any signs/symptoms of systemic infection at this time. Currently afebrile and no leukocytosis. Will therefore hold  antibiotics. Procalcitonin ordered. Agree with sputum cx given increased secretions from trach site. Negative head CT suggests against stroke. He is on several sedating medications at home, but low suspicion for medication side effect as he has had no recent changes in meds and did not take any of his home meds today. Will follow up on pending lab work and continue to monitor.  - s/p vancomycin/zosyn x1 in the ED. Will hold further antibiotic therapy.  - F/u procalcitonin and TSH  - F/u UA, sputum cx, and blood cxs  - CBC in AM   # Electrolyte abnormalities: Na 126 and K 3.4 on admission  - s/p IVF in ED and K solution 70m  - Mg pending  - Will continue to replete as needed  - Recheck BMP in AM   # AKI: Cr 1.34 likely from volume depletion in the setting of recent diarrhea  - IVF: NS _0 /hr  - BMP as above   # Hx of esophageal cancer s/p [place]oph[place] available. G tube in place. Started PO intake 2 weeks ago.  - Will hold tube feeds  - Nutritional consult  - PT/OT consult   # Constipation: Last BM 3 days ago  - Start Senokot 8.685mtablet BID per G tube   # Microcytic anemia: Hgb 9.8 and MCV 73.5. Unknown if this is chronic. 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  - Will continue home oxycodone 5mg so30mq4h PRN   # HTN: Per wife, patient has been refusing his BP home meds. Currently normotensive.  - Continue  home amlodipine 16m daily   # CHF: No TTE on chart or care everywhere. Patient's wife denies hx of heart disease.  - Continue home metoprolol succinate 1023mdaily    IVF: NS _0 /hr  Diet: NPO  DVT ppx: SQ lovenox  Code status: Full code,  confirmed on admission  Dispo: Admit patient to Observation with expected length of stay less than 2 midnights.  Signed: IdWelford RocheMD  Internal Medicine PGY-1  P [phone]/27/2018, 10:25 PM

## 2017-03-17 NOTE — Progress Notes (Signed)
Pharmacy Antibiotic Note Adam Chambers. is a 76 y.o. male admitted on 03/17/2017 with concern for PNA.  Pharmacy has been consulted for Zosyn and vancomycin dosing.  Plan: 1. Vancomycin 1000 IV every 24 hours.  Goal trough 15-20 mcg/mL. 2. Zosyn 3.375g IV q8h (4 hour infusion).  3. BMP in am to assess renal function.   Weight: 160 lb 15 oz (73 kg)  Temp (24hrs), Avg:98.9 F (37.2 C), Min:98.9 F (37.2 C), Max:98.9 F (37.2 C)   Recent Labs Lab 03/17/17 1924 03/17/17 1935  WBC 6.6  --   CREATININE 1.34*  --   LATICACIDVEN  --  0.97    CrCl cannot be calculated (Unknown ideal weight.).    Allergies  Allergen Reactions  . Simvastatin Nausea And Vomiting    Antimicrobials this admission: 7/27 Zosyn >>  7/27 vancomycin >>    Microbiology results: 7/27 BCx: px  Thank you for allowing pharmacy to be a part of this patient's care.  Vincenza Hews, PharmD, BCPS 03/17/2017, 8:58 PM

## 2017-03-17 NOTE — ED Triage Notes (Addendum)
Pt arrived via [facility] after his wife found pt at home in a very lethargic state in his bed, which she states is not normal for him. Pt has hx of cancer and has trach in place and is nonverbal. Per EMS, VSS on scene and en route. Pt was lethargic upon arrival, responding to verbal stimuli after several attempts. Pt LSN this morning at 0930 by wife.

## 2017-03-17 NOTE — ED Notes (Signed)
Pt is alert and communicating well with staff and family, within his physical  Limitations.

## 2017-03-17 NOTE — ED Notes (Signed)
Next istat lactic due at 2235

## 2017-03-17 NOTE — ED Provider Notes (Signed)
[facility] DEPT Provider Note   CSN: [id number] Arrival date & time: 03/17/17  1720     History   Chief Complaint Chief Complaint  Patient presents with  . Altered Mental Status    HPI Adam Chambers. is a 76 y.o. male.  HPI   76 year old male with extensive past medical history including hypertension, hyperlipidemia, tonsillar cancer status post recent laryngopharyngectomy here with altered mental status. The patient reportedly was recently hospitalized in [place] for possible pneumonia. He was sent home with improvement of his symptoms. Over the last several days, he has been more fatigued than usual and has had diarrhea with his tube feeds so he has not been getting as much. Earlier today, the patient seemed more tired this morning. After around 11:00, he was less responsive. Family was unable to wake him to the brought him to the ER. They do note that he has had significantly increased and thickened secretions over the last several days as well. No fevers or chills. No vomiting.  Past Medical History:  Diagnosis Date  . Allergic rhinitis   . Blood transfusion   . Blurred vision, left eye   . BPH (benign prostatic hypertrophy)   . CHF (congestive heart failure) ([place])   . Chronic back pain   . 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 Hepatitis 1995   food?  . History of chemotherapy 12/28/09 & then again 02/02/10 & 6/21/211   1 cycle cisplatin, then taxol & carboplatin  . Hx of radiation therapy 5/9/211-02/09/2010   rad tx tonsillar region   . Hypertension   . Hypothyroid 02/02/2012  . Hypothyroid 02/02/2012  . Otitis media    left eye  . Spinal stenosis    cervical and lumbar  . 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 Thyroid disease    secondary to radiation therapy  . 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 Benign prostatic  hyperplasia 03/18/2017  . Hemorrhoids 03/18/2017  . Hypertension 03/18/2017  . Spinal stenosis 03/18/2017  . Hyponatremia 03/18/2017  . Altered mental status 03/17/2017  . Protein malnutrition ([place]) 08/26/2016  . 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 Xerostomia 01/30/2014  . Dysphagia 01/30/2014  . Elevated serum creatinine 01/30/2014  . Hypothyroidism 02/02/2012  . Chronic back pain 07/01/2011  . Tonsil cancer [facility])     Past Surgical History:  Procedure Laterality Date  . BACK SURGERY     1992  . CERVICAL FUSION    . ESOPHAGOGASTRODUODENOSCOPY  03/21/14  . ESOPHAGOGASTRODUODENOSCOPY N/A 05/05/2014   Procedure: ESOPHAGOGASTRODUODENOSCOPY (EGD);  Surgeon: Gatha Mayer, MD;  Location: [facility];  Service: Endoscopy;  Laterality: N/A;  . ESOPHAGOGASTRODUODENOSCOPY N/A 07/02/2014   Procedure: ESOPHAGOGASTRODUODENOSCOPY (EGD);  Surgeon: Gatha Mayer, MD;  Location: [facility];  Service: Endoscopy;  Laterality: N/A;  Clips, Nasobiliary Drain, Nasogastric Tube  . GASTROSTOMY TUBE PLACEMENT     for nutrition /hydration , s/p mucositis assoc with odynophagia&dysphasia  . REFRACTIVE SURGERY     left eye  . TONSILLECTOMY         Home Medications    Prior to Admission medications   Medication Sig Start Date End Date Taking? Authorizing Provider  amLODipine (NORVASC) 5 MG tablet Take 10 mg by mouth daily.    Yes [provider]  benazepril (LOTENSIN) 20 MG tablet Take 20 mg by mouth daily.   Yes [provider]  diazepam (VALIUM) 5 MG tablet Take 5 mg by mouth every 6 (six) hours as needed for anxiety.    Yes [provider]  doxazosin (CARDURA) 8 MG tablet Take 8 mg by mouth daily as needed (for blood pressure).    Yes [provider]  flunisolide (NASAREL) 29 MCG/ACT (0.025%) nasal spray Place 2 sprays into the nose daily as needed. Dose is for each nostril.    Yes [provider]  levothyroxine (SYNTHROID, LEVOTHROID) 100 MCG tablet Take 100 mcg by mouth daily before breakfast.   Yes [provider]  metoprolol (TOPROL-XL) 200 MG 24 hr tablet Take 100 mg by mouth daily.    Yes [provider]  oxyCODONE (ROXICODONE) 5 MG/5ML solution Take 5 mLs (5 mg total) by mouth every 4 (four) hours as needed for severe pain. 06/02/14  Yes Gery Pray, MD  oxyCODONE-acetaminophen (PERCOCET/ROXICET) 5-325 MG per tablet Take 1-2 tablets by mouth every 6 (six) hours as needed for severe pain. 12/08/13  Yes Mabe, Shanon Brow, NP  traZODone (DESYREL) 50 MG tablet Take 50 mg by mouth at bedtime.   Yes [provider]  levothyroxine (SYNTHROID) 75 MCG tablet Take 1 tablet (75 mcg total) by mouth daily before breakfast. Patient not taking: Reported on 03/17/2017 11/19/13   Heath Lark, MD    Family History Family History  Problem Relation Age of Onset  . Lung cancer Sister   . Diabetes Brother     Social History Social History  Substance Use Topics  . Smoking status: Former Smoker    Packs/day: 2.00    Years: 34.00    Types: Cigarettes    Quit date: 06/30/1993  . Smokeless tobacco: Never Used  . Alcohol use No     Allergies   Simvastatin   Review of Systems Review of Systems  Unable to perform ROS: Mental status change  Constitutional: Positive for fatigue.     Physical Exam Updated Vital Signs BP (!) 150/97   Pulse 84   Temp 98 F (36.7 C) (Oral)   Resp 14   Wt 73 kg (160 lb 15 oz)   SpO2 100%   BMI 19.59 kg/m   Physical Exam  Constitutional: He appears well-developed and well-nourished. No distress.  HENT:  Head: Normocephalic and atraumatic.  Dry MM  Eyes: Conjunctivae are normal.  Neck:  S/p laryngopharyngectomy. Tracheostomy with copious amounts of yellow, thick secretions.  Cardiovascular: Normal rate, regular rhythm and normal heart sounds.  Exam reveals no friction rub.   No murmur heard. Pulmonary/Chest:  Effort normal. Tachypnea noted. No respiratory distress. He has decreased breath sounds. He has no wheezes. He has rhonchi in the right middle field, the right lower field, the left middle field and the left lower field. He has no rales.  Abdominal: Soft. He exhibits no distension.  Musculoskeletal: He exhibits no edema.  Neurological: He exhibits normal muscle tone.  Drowsy, responds to painful stimuli but easily falls back asleep. Face is symmetric. No seizure-like activity.  Skin: Skin is warm. Capillary refill takes less than 2 seconds.  Psychiatric: He has a normal mood and affect.  Nursing note and vitals reviewed.    ED Treatments / Results  Labs (all labs ordered are listed, but only abnormal results are displayed) Labs Reviewed  COMPREHENSIVE METABOLIC PANEL - Abnormal; Notable for the following:       Result Value  Sodium 126 (*)    Potassium 3.4 (*)    Chloride 85 (*)    Creatinine, Ser 1.34 (*)    Calcium 8.3 (*)    Total Protein 6.1 (*)    Albumin 2.3 (*)    AST 13 (*)    ALT 7 (*)    GFR calc non Af Amer 50 (*)    GFR calc Af Amer 58 (*)    All other components within normal limits  CBC WITH DIFFERENTIAL/PLATELET - Abnormal; Notable for the following:    RBC 4.07 (*)    Hemoglobin 9.8 (*)    HCT 29.9 (*)    MCV 73.5 (*)    MCH 24.1 (*)    RDW 17.5 (*)    Platelets 431 (*)    Lymphs Abs 0.5 (*)    All other components within normal limits  BASIC METABOLIC PANEL - Abnormal; Notable for the following:    Sodium 126 (*)    Chloride 89 (*)    Creatinine, Ser 1.26 (*)    Calcium 8.3 (*)    GFR calc non Af Amer 54 (*)    All other components within normal limits  CBC - Abnormal; Notable for the following:    RBC 4.11 (*)    Hemoglobin 9.9 (*)    HCT 30.1 (*)    MCV 73.2 (*)    MCH 24.1 (*)    RDW 17.4 (*)    All other components within normal limits  TSH - Abnormal; Notable for the following:    TSH 86.889 (*)    All other components within normal  limits  T4, FREE - Abnormal; Notable for the following:    Free T4 0.49 (*)    All other components within normal limits  PHOSPHORUS - Abnormal; Notable for the following:    Phosphorus 2.4 (*)    All other components within normal limits  I-STAT ARTERIAL BLOOD GAS, ED - Abnormal; Notable for the following:    pH, Arterial 7.503 (*)    Bicarbonate 34.2 (*)    Acid-Base Excess 10.0 (*)    All other components within normal limits  CULTURE, BLOOD (ROUTINE X 2)  CULTURE, BLOOD (ROUTINE X 2)  CULTURE, RESPIRATORY (NON-EXPECTORATED)  PROCALCITONIN  MAGNESIUM  MAGNESIUM  URINALYSIS, ROUTINE W REFLEX MICROSCOPIC  MAGNESIUM  PHOSPHORUS  I-STAT CG4 LACTIC ACID, ED  I-STAT TROPONIN, ED  I-STAT CG4 LACTIC ACID, ED    EKG  EKG Interpretation  Date/Time:  Friday March 17 2017 19:59:55 EDT Ventricular Rate:  81 PR Interval:    QRS Duration: 100 QT Interval:  396 QTC Calculation: 460 R Axis:   -37 Text Interpretation:  Sinus rhythm Left axis deviation Probable anteroseptal infarct, old No significant change since last tracing Confirmed by Duffy Bruce [phone]) on 03/18/2017 12:46:49 PM       Radiology X-ray Chest Pa And Lateral  Result Date: 03/18/2017 CLINICAL DATA:  Found in bed with altered mental status tonight. EXAM: CHEST  2 VIEW COMPARISON:  03/17/2017 at 18:27 FINDINGS: Patchy airspace opacity in the right upper lobe inferiorly may represent pneumonia. Small right pleural effusion. Pulmonary vasculature is normal. Hilar, mediastinal and cardiac contours are unremarkable. IMPRESSION: Probable right upper lobe pneumonia.  Small right pleural effusion. Electronically Signed   By: Andreas Newport M.D.   On: 03/18/2017 01:30   Ct Head Wo Contrast  Result Date: 03/17/2017 CLINICAL DATA:  Altered mental status. EXAM: CT HEAD WITHOUT CONTRAST TECHNIQUE: Contiguous axial images were  obtained from the base of the skull through the vertex without intravenous contrast. COMPARISON:   None. FINDINGS: Brain: Diffusely enlarged ventricles and subarachnoid spaces. No intracranial hemorrhage, mass lesion or CT evidence of acute infarction. Vascular: No hyperdense vessel or unexpected calcification. Skull: Normal. Negative for fracture or focal lesion. Sinuses/Orbits: Unremarkable. Other: None. IMPRESSION: 1. No acute abnormality. 2. Mild diffuse cerebral and cerebellar atrophy, age appropriate. Electronically Signed   By: Claudie Revering M.D.   On: 03/17/2017 18:55   Dg Chest Portable 1 View  Result Date: 03/17/2017 CLINICAL DATA:  Altered mental status, short of breath EXAM: PORTABLE CHEST 1 VIEW COMPARISON:  02/18/2010 FINDINGS: Partially visualized hardware in the lower cervical spine. Patchy ground-glass density in the right mid lung. Hazy opacity at the left lung base. Possible tiny right pleural effusion. Stable cardiomediastinal silhouette with aortic atherosclerosis. No pneumothorax. IMPRESSION: 1. Patchy ground-glass density in the peripheral right mid lung suspicious for infiltrates. Additional opacity at the left lung base suspicious for atelectasis or pneumonia. Probable tiny right pleural effusion. Electronically Signed   By: Donavan Foil M.D.   On: 03/17/2017 18:38    Procedures Procedures (including critical care time)  Medications Ordered in ED Medications  levothyroxine (SYNTHROID, LEVOTHROID) tablet 100 mcg (100 mcg Oral Given 03/18/17 1132)  traZODone (DESYREL) tablet 50 mg (50 mg Oral Given 03/18/17 0056)  oxyCODONE (ROXICODONE) 5 MG/5ML solution 5 mg (5 mg Oral Given 03/18/17 1132)  metoprolol succinate (TOPROL-XL) 24 hr tablet 100 mg (100 mg Oral Given 03/18/17 1132)  amLODipine (NORVASC) tablet 10 mg (10 mg Oral Given 03/18/17 1136)  diazepam (VALIUM) tablet 5 mg (not administered)  enoxaparin (LOVENOX) injection 40 mg (not administered)  0.9 %  sodium chloride infusion ( Intravenous New Bag/Given 03/18/17 1132)  Melatonin TABS 18 mg (not administered)    polyethylene glycol (MIRALAX / GLYCOLAX) packet 17 g (17 g Oral Given 03/18/17 1140)  feeding supplement (VITAL 1.5 CAL) liquid 1,000 mL (not administered)  0.9 %  sodium chloride infusion ( Intravenous Stopped 03/18/17 0431)  piperacillin-tazobactam (ZOSYN) IVPB 3.375 g (0 g Intravenous Stopped 03/17/17 2042)  vancomycin (VANCOCIN) IVPB 1000 mg/200 mL premix (0 mg Intravenous Stopped 03/17/17 2132)  potassium chloride 20 MEQ/15ML (10%) solution 40 mEq (40 mEq Per Tube Given 03/18/17 0056)     Initial Impression / Assessment and Plan / ED Course  I have reviewed the triage vital signs and the nursing notes.  Pertinent labs & imaging results that were available during my care of the patient were reviewed by me and considered in my medical decision making (see chart for details).     76 yo M with extensive PMHx including lingual CA s/p laryngopharyngectomy here with AMS in setting of increasingly thick trach secretions. CXR is concerning for multifocal PNA. Suspect tracheitis with superimposed HCAP. IV ABX given. No signs of severe sepsis. Regarding his AMS, he is increasingly awake/alert. Unclear etiology btu likely delirium 2/2 HAP, possibly with component of transient hypoxia from mucus plugging. CT head neg. Admit to hospitalist.  Final Clinical Impressions(s) / ED Diagnoses   Final diagnoses:  Pneumonia  HCAP (healthcare-associated pneumonia)  Acute confusional state    New Prescriptions Current Discharge Medication List       Duffy Bruce, MD 03/18/17 1248

## 2017-03-18 ENCOUNTER — Observation Stay (HOSPITAL_COMMUNITY): Payer: Non-veteran care

## 2017-03-18 DIAGNOSIS — J690 Pneumonitis due to inhalation of food and vomit: Secondary | ICD-10-CM | POA: Diagnosis present

## 2017-03-18 DIAGNOSIS — F4321 Adjustment disorder with depressed mood: Secondary | ICD-10-CM | POA: Diagnosis present

## 2017-03-18 DIAGNOSIS — T8183XA Persistent postprocedural fistula, initial encounter: Secondary | ICD-10-CM | POA: Diagnosis present

## 2017-03-18 DIAGNOSIS — R404 Transient alteration of awareness: Secondary | ICD-10-CM | POA: Diagnosis not present

## 2017-03-18 DIAGNOSIS — N390 Urinary tract infection, site not specified: Secondary | ICD-10-CM | POA: Diagnosis not present

## 2017-03-18 DIAGNOSIS — Y95 Nosocomial condition: Secondary | ICD-10-CM | POA: Diagnosis present

## 2017-03-18 DIAGNOSIS — E46 Unspecified protein-calorie malnutrition: Secondary | ICD-10-CM | POA: Diagnosis not present

## 2017-03-18 DIAGNOSIS — J9504 Tracheo-esophageal fistula following tracheostomy: Secondary | ICD-10-CM | POA: Diagnosis not present

## 2017-03-18 DIAGNOSIS — R4182 Altered mental status, unspecified: Secondary | ICD-10-CM | POA: Diagnosis present

## 2017-03-18 DIAGNOSIS — T8132XA Disruption of internal operation (surgical) wound, not elsewhere classified, initial encounter: Secondary | ICD-10-CM | POA: Diagnosis not present

## 2017-03-18 DIAGNOSIS — K567 Ileus, unspecified: Secondary | ICD-10-CM | POA: Diagnosis not present

## 2017-03-18 DIAGNOSIS — E871 Hypo-osmolality and hyponatremia: Secondary | ICD-10-CM | POA: Diagnosis present

## 2017-03-18 DIAGNOSIS — J189 Pneumonia, unspecified organism: Secondary | ICD-10-CM | POA: Diagnosis not present

## 2017-03-18 DIAGNOSIS — Y838 Other surgical procedures as the cause of abnormal reaction of the patient, or of later complication, without mention of misadventure at the time of the procedure: Secondary | ICD-10-CM | POA: Diagnosis present

## 2017-03-18 DIAGNOSIS — Z930 Tracheostomy status: Secondary | ICD-10-CM | POA: Diagnosis not present

## 2017-03-18 DIAGNOSIS — I959 Hypotension, unspecified: Secondary | ICD-10-CM | POA: Diagnosis not present

## 2017-03-18 DIAGNOSIS — F05 Delirium due to known physiological condition: Secondary | ICD-10-CM | POA: Diagnosis present

## 2017-03-18 DIAGNOSIS — I10 Essential (primary) hypertension: Secondary | ICD-10-CM | POA: Diagnosis present

## 2017-03-18 DIAGNOSIS — N400 Enlarged prostate without lower urinary tract symptoms: Secondary | ICD-10-CM | POA: Insufficient documentation

## 2017-03-18 DIAGNOSIS — E873 Alkalosis: Secondary | ICD-10-CM | POA: Diagnosis present

## 2017-03-18 DIAGNOSIS — M549 Dorsalgia, unspecified: Secondary | ICD-10-CM | POA: Diagnosis present

## 2017-03-18 DIAGNOSIS — E43 Unspecified severe protein-calorie malnutrition: Secondary | ICD-10-CM | POA: Diagnosis present

## 2017-03-18 DIAGNOSIS — K649 Unspecified hemorrhoids: Secondary | ICD-10-CM | POA: Insufficient documentation

## 2017-03-18 DIAGNOSIS — T8131XA Disruption of external operation (surgical) wound, not elsewhere classified, initial encounter: Secondary | ICD-10-CM | POA: Diagnosis present

## 2017-03-18 DIAGNOSIS — M4800 Spinal stenosis, site unspecified: Secondary | ICD-10-CM | POA: Insufficient documentation

## 2017-03-18 DIAGNOSIS — K911 Postgastric surgery syndromes: Secondary | ICD-10-CM | POA: Diagnosis present

## 2017-03-18 DIAGNOSIS — E039 Hypothyroidism, unspecified: Secondary | ICD-10-CM | POA: Diagnosis present

## 2017-03-18 DIAGNOSIS — G8929 Other chronic pain: Secondary | ICD-10-CM | POA: Diagnosis present

## 2017-03-18 DIAGNOSIS — N179 Acute kidney failure, unspecified: Secondary | ICD-10-CM | POA: Diagnosis present

## 2017-03-18 DIAGNOSIS — E878 Other disorders of electrolyte and fluid balance, not elsewhere classified: Secondary | ICD-10-CM | POA: Diagnosis not present

## 2017-03-18 DIAGNOSIS — E876 Hypokalemia: Secondary | ICD-10-CM | POA: Diagnosis present

## 2017-03-18 DIAGNOSIS — C099 Malignant neoplasm of tonsil, unspecified: Secondary | ICD-10-CM | POA: Diagnosis present

## 2017-03-18 DIAGNOSIS — K9409 Other complications of colostomy: Secondary | ICD-10-CM | POA: Diagnosis not present

## 2017-03-18 DIAGNOSIS — G9341 Metabolic encephalopathy: Secondary | ICD-10-CM | POA: Diagnosis present

## 2017-03-18 DIAGNOSIS — Z681 Body mass index (BMI) 19 or less, adult: Secondary | ICD-10-CM | POA: Diagnosis not present

## 2017-03-18 DIAGNOSIS — E861 Hypovolemia: Secondary | ICD-10-CM | POA: Diagnosis not present

## 2017-03-18 LAB — BASIC METABOLIC PANEL
ANION GAP: 9 (ref 5–15)
BUN: 14 mg/dL (ref 6–20)
CO2: 28 mmol/L (ref 22–32)
Calcium: 8.3 mg/dL — ABNORMAL LOW (ref 8.9–10.3)
Chloride: 89 mmol/L — ABNORMAL LOW (ref 101–111)
Creatinine, Ser: 1.26 mg/dL — ABNORMAL HIGH (ref 0.61–1.24)
GFR, EST NON AFRICAN AMERICAN: 54 mL/min — AB (ref >60)
Glucose, Bld: 85 mg/dL (ref 65–99)
POTASSIUM: 3.8 mmol/L (ref 3.5–5.1)
SODIUM: 126 mmol/L — AB (ref 135–145)

## 2017-03-18 LAB — PHOSPHORUS
PHOSPHORUS: 2.4 mg/dL — AB (ref 2.5–4.6)
Phosphorus: 2.4 mg/dL — ABNORMAL LOW (ref 2.5–4.6)

## 2017-03-18 LAB — URINALYSIS, ROUTINE W REFLEX MICROSCOPIC
Bilirubin Urine: NEGATIVE
Glucose, UA: NEGATIVE mg/dL
Hgb urine dipstick: NEGATIVE
Ketones, ur: 5 mg/dL — AB
Leukocytes, UA: NEGATIVE
NITRITE: NEGATIVE
PROTEIN: NEGATIVE mg/dL
SPECIFIC GRAVITY, URINE: 1.013 (ref 1.005–1.030)
pH: 6 (ref 5.0–8.0)

## 2017-03-18 LAB — CBC
HEMATOCRIT: 30.1 % — AB (ref 39.0–52.0)
HEMOGLOBIN: 9.9 g/dL — AB (ref 13.0–17.0)
MCH: 24.1 pg — AB (ref 26.0–34.0)
MCHC: 32.9 g/dL (ref 30.0–36.0)
MCV: 73.2 fL — ABNORMAL LOW (ref 78.0–100.0)
Platelets: 393 10*3/uL (ref 150–400)
RBC: 4.11 MIL/uL — AB (ref 4.22–5.81)
RDW: 17.4 % — AB (ref 11.5–15.5)
WBC: 6.5 10*3/uL (ref 4.0–10.5)

## 2017-03-18 LAB — GLUCOSE, CAPILLARY
GLUCOSE-CAPILLARY: 96 mg/dL (ref 65–99)
GLUCOSE-CAPILLARY: 97 mg/dL (ref 65–99)
Glucose-Capillary: 79 mg/dL (ref 65–99)

## 2017-03-18 LAB — PROCALCITONIN: PROCALCITONIN: 0.1 ng/mL

## 2017-03-18 LAB — T4, FREE: Free T4: 0.49 ng/dL — ABNORMAL LOW (ref 0.61–1.12)

## 2017-03-18 LAB — MAGNESIUM
MAGNESIUM: 2.1 mg/dL (ref 1.7–2.4)
Magnesium: 1.9 mg/dL (ref 1.7–2.4)
Magnesium: 1.9 mg/dL (ref 1.7–2.4)

## 2017-03-18 LAB — TSH: TSH: 86.889 u[IU]/mL — ABNORMAL HIGH (ref 0.350–4.500)

## 2017-03-18 MED ORDER — NON FORMULARY: 20.0000 mg | Freq: Every day | Status: DC

## 2017-03-18 MED ORDER — TRAZODONE HCL 50 MG PO TABS
50.0000 mg | ORAL_TABLET | Freq: Every day | ORAL | Status: DC
  Administered 2017-03-18 – 2017-03-26 (×10): 50 mg via ORAL
  Filled 2017-03-18 (×10): qty 1

## 2017-03-18 MED ORDER — SODIUM CHLORIDE 0.9 % IV SOLN
INTRAVENOUS | Status: AC
  Administered 2017-03-18 (×2): via INTRAVENOUS

## 2017-03-18 MED ORDER — VITAL HIGH PROTEIN PO LIQD: 1000.0000 mL | ORAL | Status: DC

## 2017-03-18 MED ORDER — OXYCODONE HCL 5 MG/5ML PO SOLN
5.0000 mg | ORAL | Status: DC | PRN
  Administered 2017-03-18 – 2017-03-24 (×25): 5 mg via ORAL
  Filled 2017-03-18 (×26): qty 5

## 2017-03-18 MED ORDER — VITAL 1.5 CAL PO LIQD
1000.0000 mL | ORAL | Status: DC
  Administered 2017-03-20: 1000 mL
  Filled 2017-03-18 (×3): qty 1000

## 2017-03-18 MED ORDER — ENOXAPARIN SODIUM 40 MG/0.4ML ~~LOC~~ SOLN
40.0000 mg | SUBCUTANEOUS | Status: DC
  Administered 2017-03-18 – 2017-03-27 (×10): 40 mg via SUBCUTANEOUS
  Filled 2017-03-18 (×10): qty 0.4

## 2017-03-18 MED ORDER — DIAZEPAM 5 MG PO TABS
5.0000 mg | ORAL_TABLET | Freq: Four times a day (QID) | ORAL | Status: DC | PRN
  Administered 2017-03-21: 5 mg via ORAL
  Filled 2017-03-18: qty 1

## 2017-03-18 MED ORDER — METOPROLOL SUCCINATE ER 100 MG PO TB24
100.0000 mg | ORAL_TABLET | Freq: Every day | ORAL | Status: DC
  Administered 2017-03-18 – 2017-03-19 (×2): 100 mg via ORAL
  Filled 2017-03-18 (×3): qty 1

## 2017-03-18 MED ORDER — POTASSIUM CHLORIDE 20 MEQ/15ML (10%) PO SOLN
40.0000 meq | Freq: Once | ORAL | Status: AC
  Administered 2017-03-18: 40 meq
  Filled 2017-03-18: qty 30

## 2017-03-18 MED ORDER — POLYETHYLENE GLYCOL 3350 17 G PO PACK
17.0000 g | PACK | Freq: Every day | ORAL | Status: DC
  Administered 2017-03-18 – 2017-03-19 (×2): 17 g via ORAL
  Filled 2017-03-18 (×3): qty 1

## 2017-03-18 MED ORDER — VITAL 1.5 CAL PO LIQD
1000.0000 mL | ORAL | Status: DC
  Administered 2017-03-18: 1000 mL
  Filled 2017-03-18: qty 1000

## 2017-03-18 MED ORDER — AMLODIPINE BESYLATE 10 MG PO TABS
10.0000 mg | ORAL_TABLET | Freq: Every day | ORAL | Status: DC
  Administered 2017-03-18 – 2017-03-27 (×10): 10 mg via ORAL
  Filled 2017-03-18 (×11): qty 1

## 2017-03-18 MED ORDER — MELATONIN 3 MG PO TABS
18.0000 mg | ORAL_TABLET | Freq: Every day | ORAL | Status: DC
  Administered 2017-03-18 – 2017-03-26 (×9): 18 mg via ORAL
  Filled 2017-03-18 (×10): qty 6

## 2017-03-18 MED ORDER — LEVOTHYROXINE SODIUM 100 MCG PO TABS
100.0000 ug | ORAL_TABLET | Freq: Every day | ORAL | Status: DC
  Administered 2017-03-18 – 2017-03-27 (×10): 100 ug via ORAL
  Filled 2017-03-18 (×10): qty 1

## 2017-03-18 MED ORDER — SODIUM CHLORIDE 0.9 % IV SOLN
INTRAVENOUS | Status: AC
  Administered 2017-03-18: 12:00:00 via INTRAVENOUS

## 2017-03-18 MED ORDER — SENNA 8.6 MG PO TABS
1.0000 | ORAL_TABLET | Freq: Two times a day (BID) | ORAL | Status: DC
  Filled 2017-03-18: qty 1

## 2017-03-18 NOTE — Progress Notes (Signed)
Initial Nutrition Assessment  DOCUMENTATION CODES:   Severe malnutrition in context of chronic illness  INTERVENTION:   Tube Feeding Recommendations:  Recommend starting Vital 1.5 at rate of 20 ml/hr via Jejunal tube today (to infuse for 15 hours from 0600 to 2300 daily).   Goal rate of Vital 1.5 is 95 ml/hr over 15 hours providing 2280 kcals, 97 g of protein and 1083 mL of free water. Addition of Pro-Stat 30 mL daily providing additional 100 kcals and 15 g of protein. Pt will need additional free water flushes once tolerance is established, currently on IVF. Will need to adjust TF Regimen if pt unable to tolerate 95 ml/hr rate  Monitor magnesium, potassium, and phosphorus daily for at least 3 days, MD to replete as needed, as pt is at risk for refeeding syndrome given severe malnutrition in context of chronic illness, minimal nutrition since surgery.   NUTRITION DIAGNOSIS:   Malnutrition (Severe) related to chronic illness, cancer and cancer related treatments (dysphagia) as evidenced by severe depletion of body fat, severe depletion of muscle mass.  GOAL:   Patient will meet greater than or equal to 90% of their needs  MONITOR:   TF tolerance, Vent status, Diet advancement, Weight trends, I & O's, Skin  REASON FOR ASSESSMENT:   Consult Enteral/tube feeding initiation and management  ASSESSMENT:   76 yo male admitted with AMS, diarrhea with hypokalemia and hyponatremia. Pt with hx of esophageal cancer s/p esophagectomy (total per wife) and laryngopharyngectomy on 6/22 with hx of chemo/radiation, G-tube placement, tonsil cancer, CHF, HTN, chronic pain   Pt on 40% trach collar, alert, wife at bedside  Wife reports pt had G-tube placed in December. Post surgery in June of this year, G-tube was transitioned to a J-tube. RD assessed feeding tube and tubing indicates Jejunal tube as well. Pt has been infusing 2-3 cartons of Isosource 1.5 over 2 hours at home. Pt reports diarrhea  post every feeding and has been refusing feedings at times. In fact, on visit today, pt did not want to start feedings until he is off antibiotic therapy. Discussed with pt that it is possible that diarrhea may be due to tube feeding infusing too rapidly at home. Explained that the Jejunum does not have a pouch and TF typically needs to be infused at a slower, continuous rate in order to prevent diarrhea ( this is unlike the stomach which has a pouch and acts as a "holding tank" releasing stomach contents into small intestine in small amounts over time. Pt then agreeable to try low rate of continuous feedings for part of the day (pt did not want 24 hours feedings at his time). Pt also did not want to continue a formula with fiber. Discussed options and pt agreeable to trying Vital products. Plan to begin Vital 1.5 at rate of 20 ml/hr today for 15 hours.   Noted possible concern of pt refluxing TF. Per hx, pt has J-tube and unless tube tip as migrated and is no longer located in Jejunum, then it is highly unlikely that the white/yellow colored secretions are tube feeding. If concern, recommend abdominal scan  Per MD note, pt started PO intake 2 weeks ago. Per pt and his wife, pt eating very minimally, bites of broth and baked potato. Pt has experienced diarrhea post oral intake as well. NPO at present, noted SLP consulted.   Wife reports pt weighed 161 pound on June 22nd (day of surgery); reports wt down to 144 pounds. However, note wt  of 160 pounds today. Per chart review, pt weighed 195 pounds in October. 18% wt loss in 9 months with current wt of 160 which is significant for time frame.   Nutrition-Focused physical exam completed. Findings are severe fat depletion, severe muscle depletion, and no edema.   Labs: sodium 126, [place] 1.26 Meds: NS at 100 ml/hr  Diet Order:  Diet NPO time specified  Skin:  Reviewed, no issues  Last BM:  no documented BM  Height:   Ht Readings from Last 1  Encounters:  06/14/16 6\' 4"  (1.93 m)    Weight:   Wt Readings from Last 1 Encounters:  03/17/17 160 lb 15 oz (73 kg)    Ideal Body Weight:  91.8 kg  BMI:  Body mass index is 19.59 kg/m.  Estimated Nutritional Needs:   Kcal:  6433-2951 kcals  Protein:  110-130 g  Fluid:  >/= 2.2 L  EDUCATION NEEDS:   Education needs addressed  Kerman Passey MS, RD, LDN [phone] Pager  [phone] Weekend/On-Call Pager

## 2017-03-18 NOTE — Evaluation (Signed)
Occupational Therapy Evaluation Patient Details Name: Adam Chambers. MRN: [id number] DOB: 1941-07-10 Today's Date: 03/18/2017    History of Present Illness Mr. Eber is a 76yo man with PMH of esophageal cancer s/p laryngopharyngectomy (salvage), who presented for AMS and generalized weakness.    Clinical Impression   Pt admitted with the above diagnoses and presents with below problem list. Pt will benefit from continued acute OT to address the below listed deficits and maximize independence with basic ADLs prior to d/c home with family assisting. PTA pt was independent with ADLs. Pt is currently min -mod +2 A with functional transfers and LB ADLs. Family present and involved in session. Pt's spouse reports pt has been feeling very down, multiple family members call at home to encourage pt to get out of bed. Pt appearing withdrawn at times, flat affect.     Follow Up Recommendations  Home health OT;Supervision/Assistance - 24 hour    Equipment Recommendations  3 in 1 bedside commode    Recommendations for Other Services       Precautions / Restrictions Precautions Precautions: Fall      Mobility Bed Mobility Overal bed mobility: Needs Assistance Bed Mobility: Supine to Sit     Supine to sit: Mod assist     General bed mobility comments: Light mod assist to elevate trunk to sit  Transfers Overall transfer level: Needs assistance Equipment used: 2 person hand held assist Transfers: Sit to/from Stand;Stand Pivot Transfers Sit to Stand: Min assist;+2 physical assistance Stand pivot transfers: Mod assist;+2 physical assistance       General transfer comment: Noting enough strength to power up, but poor muscle endurance; needing bilateral UE support; Loss of balance while taking pivotal steps bed to recliner, requiring mod assist to correct    Balance Overall balance assessment: Needs assistance Sitting-balance support: Feet supported;Bilateral upper extremity  supported Sitting balance-Leahy Scale: Fair     Standing balance support: Bilateral upper extremity supported Standing balance-Leahy Scale: Poor                             ADL either performed or assessed with clinical judgement   ADL Overall ADL's : Needs assistance/impaired Eating/Feeding: NPO Eating/Feeding Details (indicate cue type and reason): tube fed Grooming: Set up;Minimal assistance;Sitting Grooming Details (indicate cue type and reason): fatigue likely a factor Upper Body Bathing: Minimal assistance;Sitting   Lower Body Bathing: Moderate assistance;+2 for physical assistance;Minimal assistance;Sit to/from stand   Upper Body Dressing : Moderate assistance;Sitting   Lower Body Dressing: Minimal assistance;Moderate assistance;+2 for physical assistance;Sit to/from stand   Toilet Transfer: Moderate assistance;+2 for physical assistance;Stand-pivot;BSC   Toileting- Clothing Manipulation and Hygiene: Moderate assistance;+2 for physical assistance;Sit to/from stand   Tub/ Banker: Moderate assistance;+2 for physical assistance;Stand-pivot;3 in 1     General ADL Comments: With some encouragement of family, pt completed bed mobility and SPT to recliner. Family present.     Vision         Perception     Praxis      Pertinent Vitals/Pain Pain Assessment: Faces Faces Pain Scale: Hurts little more Pain Location: bil shoulders and neck; also sore tailbone Pain Descriptors / Indicators: Aching Pain Intervention(s): Monitored during session     Hand Dominance     Extremity/Trunk Assessment Upper Extremity Assessment Upper Extremity Assessment: Generalized weakness (endores shoulder pain)   Lower Extremity Assessment Lower Extremity Assessment: Defer to PT evaluation  Communication Communication Communication: Expressive difficulties;Other (comment) (uses electrolarynx post neck surgery)   Cognition Arousal/Alertness:  Awake/alert Behavior During Therapy: Flat affect Overall Cognitive Status: Within Functional Limits for tasks assessed                                 General Comments: Pt's spouse reports mulitiple family members have been encouraging him to get out of bed and sit up in the chair at home. This was going on PTA as well. Pt withdrawn at times, flat affect. Family encouraging pt to get up into chair.    General Comments  Trach collar; VSS    Exercises     Shoulder Instructions      Home Living Family/patient expects to be discharged to:: Private residence Living Arrangements: Spouse/significant other Available Help at Discharge: Family;Available 24 hours/day Type of Home: House Home Access: Stairs to enter [employer] of Steps: 3 Entrance Stairs-Rails:  (to be determined) Home Layout: One level     Bathroom Shower/Tub: Tub/shower unit         Home Equipment: Environmental consultant - 2 wheels;Walker - 4 wheels;Cane - single point;Shower seat          Prior Functioning/Environment Level of Independence: Independent        Comments: Walking 5 miles a day prior to November        OT Problem List: Decreased strength;Decreased activity tolerance;Impaired balance (sitting and/or standing);Decreased knowledge of use of DME or AE;Decreased knowledge of precautions;Pain      OT Treatment/Interventions: Self-care/ADL training;Therapeutic exercise;Energy conservation;DME and/or AE instruction;Therapeutic activities;Patient/family education;Balance training    OT Goals(Current goals can be found in the care plan section) Acute Rehab OT Goals Patient Stated Goal: to get strength back OT Goal Formulation: With patient/family Time For Goal Achievement: 04/01/17 Potential to Achieve Goals: Good ADL Goals Pt Will Perform Grooming: with set-up;sitting;with supervision (2-3 tasks) Pt Will Perform Upper Body Bathing: with min assist;sitting Pt Will Perform Lower Body  Bathing: with max assist;sit to/from stand (+1) Pt Will Perform Upper Body Dressing: with min assist;sitting Pt Will Perform Lower Body Dressing: with max assist;sit to/from stand (+1) Pt Will Transfer to Toilet: with max assist;stand pivot transfer;bedside commode (+1) Pt Will Perform Toileting - Clothing Manipulation and hygiene: with max assist;sit to/from stand Additional ADL Goal #1: Pt will complete bed mobility at min A level to prepare for OOB ADLs.   OT Frequency: Min 2X/week   Barriers to D/C:            Co-evaluation PT/OT/SLP Co-Evaluation/Treatment: Yes Reason for Co-Treatment: For patient/therapist safety;To address functional/ADL transfers PT goals addressed during session: Mobility/safety with mobility OT goals addressed during session: ADL's and self-care      AM-PAC PT "6 Clicks" Daily Activity     Outcome Measure Help from another person eating meals?:  (n/a) Help from another person taking care of personal grooming?: A Lot Help from another person toileting, which includes using toliet, bedpan, or urinal?: A Lot Help from another person bathing (including washing, rinsing, drying)?: A Lot Help from another person to put on and taking off regular upper body clothing?: A Lot Help from another person to put on and taking off regular lower body clothing?: A Lot 6 Click Score: 10   End of Session Nurse Communication: Mobility status  Activity Tolerance: Patient limited by fatigue;Patient limited by pain Patient left: in chair;with call bell/phone within reach;with family/visitor present  OT  Visit Diagnosis: Muscle weakness (generalized) (M62.81);Pain;Other abnormalities of gait and mobility (R26.89)                Time: 2060-1561 OT Time Calculation (min): 29 min Charges:  OT General Charges $OT Visit: 1 Procedure OT Evaluation $OT Eval Moderate Complexity: 1 Procedure G-Codes:      Hortencia Pilar 03/18/2017, 3:44 PM

## 2017-03-18 NOTE — ED Notes (Signed)
Hospital bed requested once again.  Charge nurse made aware.

## 2017-03-18 NOTE — Evaluation (Signed)
Physical Therapy Evaluation Patient Details Name: Adam Chambers. MRN: [id number] DOB: 28-Apr-1941 Today's Date: 03/18/2017   History of Present Illness  Adam Chambers is a 76yo man with PMH of esophageal cancer s/p laryngopharyngectomy (salvage), who presented for AMS and generalized weakness.   Clinical Impression  Pt admitted with above diagnosis. Pt currently with functional limitations due to the deficits listed below (see PT Problem List). Adam Chambers has had generalized weakness, diarrhea, and decr nutrition since his extensive surgeries (above); He presents with generalized weakness and deconditioning; I am hopeful that as his nutrition status/intake improves, he will make good physical functioning recovery; We briefly discussed the possibility of post-acute rehab, and Adam Chambers made it clear he would rather go home with [facility] therapy follow-up;  Pt will benefit from skilled PT to increase their independence and safety with mobility to allow discharge to the venue listed below.       Follow Up Recommendations Home health PT;Supervision/Assistance - 24 hour (HHOT as well)    Equipment Recommendations  3in1 (PT)    Recommendations for Other Services OT consult;Speech consult (as ordered)     Precautions / Restrictions Precautions Precautions: Fall      Mobility  Bed Mobility Overal bed mobility: Needs Assistance Bed Mobility: Supine to Sit     Supine to sit: Mod assist     General bed mobility comments: Light mod assist to elevate trunk to sit  Transfers Overall transfer level: Needs assistance Equipment used: 2 person hand held assist Transfers: Sit to/from [facility] Sit to Stand: Min assist;+2 physical assistance Stand pivot transfers: Mod assist;+2 physical assistance       General transfer comment: Noting enough strength to power up, but poor muscle endurance; needing bilateral UE support; Loss of balance while taking pivotal steps bed to recliner,  requiring mod assist to correct  Ambulation/Gait                Stairs            Wheelchair Mobility    Modified Rankin (Stroke Patients Only)       Balance Overall balance assessment: Needs assistance Sitting-balance support: Feet supported;Bilateral upper extremity supported Sitting balance-Leahy Scale: Fair     Standing balance support: Bilateral upper extremity supported Standing balance-Leahy Scale: Poor                               Pertinent Vitals/Pain Pain Assessment: Faces Faces Pain Scale: Hurts little more Pain Location: bil shoulders and neck; also sore tailbone Pain Descriptors / Indicators: Aching Pain Intervention(s): Monitored during session    Home Living Family/patient expects to be discharged to:: Private residence Living Arrangements: Spouse/significant other Available Help at Discharge: Family;Available 24 hours/day Type of Home: House Home Access: Stairs to enter Entrance Stairs-Rails:  (to be determined) Entrance Stairs-Number of Steps: 3 Home Layout: One level Home Equipment: Walker - 2 wheels;Walker - 4 wheels;Cane - single point;Shower seat      Prior Function Level of Independence: Independent         Comments: Walking 5 miles a day prior to November     Hand Dominance        Extremity/Trunk Assessment   Upper Extremity Assessment Upper Extremity Assessment: Defer to OT evaluation (reports shoulder pain)    Lower Extremity Assessment Lower Extremity Assessment: Generalized weakness       Communication   Communication: Expressive difficulties;Other (comment) (uses  electrolarynx post neck surgery)  Cognition Arousal/Alertness: Awake/alert Behavior During Therapy: WFL for tasks assessed/performed;Flat affect Overall Cognitive Status: Within Functional Limits for tasks assessed                                 General Comments: Strikes me as down      General Comments General  comments (skin integrity, edema, etc.): Trach collar; VSS    Exercises     Assessment/Plan    PT Assessment Patient needs continued PT services  PT Problem List Decreased strength;Decreased activity tolerance;Decreased balance;Decreased mobility;Decreased knowledge of use of DME;Decreased safety awareness;Decreased knowledge of precautions;Pain       PT Treatment Interventions DME instruction;Gait training;Stair training;Functional mobility training;Therapeutic activities;Therapeutic exercise;Balance training;Patient/family education    PT Goals (Current goals can be found in the Care Plan section)  Acute Rehab PT Goals Patient Stated Goal: to get strangth back PT Goal Formulation: With patient Time For Goal Achievement: 04/01/17 Potential to Achieve Goals: Good    Frequency Min 3X/week   Barriers to discharge        Co-evaluation PT/OT/SLP Co-Evaluation/Treatment: Yes Reason for Co-Treatment: For patient/therapist safety;To address functional/ADL transfers PT goals addressed during session: Mobility/safety with mobility         AM-PAC PT "6 Clicks" Daily Activity  Outcome Measure Difficulty turning over in bed (including adjusting bedclothes, sheets and blankets)?: A Little Difficulty moving from lying on back to sitting on the side of the bed? : A Little Difficulty sitting down on and standing up from a chair with arms (e.g., wheelchair, bedside commode, etc,.)?: A Little Help needed moving to and from a bed to chair (including a wheelchair)?: A Lot Help needed walking in hospital room?: A Lot Help needed climbing 3-5 steps with a railing? : A Lot 6 Click Score: 15    End of Session Equipment Utilized During Treatment: Oxygen Activity Tolerance: Patient tolerated treatment well Patient left: in chair;with call bell/phone within reach;with family/visitor present Nurse Communication: Mobility status PT Visit Diagnosis: Muscle weakness (generalized)  (M62.81);Unsteadiness on feet (R26.81)    Time: 8099-8338 PT Time Calculation (min) (ACUTE ONLY): 29 min   Charges:   PT Evaluation $PT Eval Moderate Complexity: 1 Procedure     PT G Codes:        Roney Marion, PT  Acute Rehabilitation Services Pager [phone] Office [phone]   Colletta Maryland 03/18/2017, 2:58 PM

## 2017-03-18 NOTE — Progress Notes (Signed)
03/18/2017- Respiratory care note- Pt admitted to 4E from ER.  Pt on 40% aerosol trach collar with sats of 98%.  Wife at bedside.  Pt resting comfortably at time of assessment.  Will monitor progress.

## 2017-03-18 NOTE — Progress Notes (Signed)
  Date: 03/18/2017  Patient name: Adam Chambers.  Medical record number: [id number]  Date of birth: [birth year]   I have seen and evaluated this patient and I have discussed the plan of care with the house staff. Please see Dr. Thea Gist note for complete details. I concur with her findings.    Sid Falcon, MD 03/18/2017, 8:14 PM

## 2017-03-18 NOTE — Evaluation (Signed)
Clinical/Bedside Swallow Evaluation Patient Details  Name: Adam Chambers. MRN: [id number] Date of Birth: 1941-08-19  Today's Date: 03/18/2017 Time: SLP Start Time (ACUTE ONLY): 1425 SLP Stop Time (ACUTE ONLY): 1440 SLP Time Calculation (min) (ACUTE ONLY): 15 min  Past Medical History:  Past Medical History:  Diagnosis Date  . Allergic rhinitis   . Blood transfusion   . Blurred vision, left eye   . BPH (benign prostatic hypertrophy)   . CHF (congestive heart failure) ([place])   . Chronic back pain   . Esophageal cancer ([place]) 03/2014   invasive squamous cell  . Hepatitis 1995   food?  . History of chemotherapy 12/28/09 & then again 02/02/10 & 6/21/211   1 cycle cisplatin, then taxol & carboplatin  . Hx of radiation therapy 5/9/211-02/09/2010   rad tx tonsillar region   . Hypertension   . Hypothyroid 02/02/2012  . Hypothyroid 02/02/2012  . Otitis media    left eye  . Spinal stenosis    cervical and lumbar  . 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 Thyroid disease    secondary to radiation therapy  . 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 BACK SURGERY     1992  . CERVICAL FUSION    . ESOPHAGOGASTRODUODENOSCOPY  03/21/14  . ESOPHAGOGASTRODUODENOSCOPY N/A 05/05/2014   Procedure: ESOPHAGOGASTRODUODENOSCOPY (EGD);  Surgeon: Gatha Mayer, MD;  Location: [facility];  Service: Endoscopy;  Laterality: N/A;  . ESOPHAGOGASTRODUODENOSCOPY N/A 07/02/2014   Procedure: ESOPHAGOGASTRODUODENOSCOPY (EGD);  Surgeon: Gatha Mayer, MD;  Location: [facility];  Service: Endoscopy;  Laterality: N/A;  Clips, Nasobiliary Drain, Nasogastric Tube  . GASTROSTOMY TUBE PLACEMENT     for nutrition /hydration , s/p mucositis assoc with odynophagia&dysphasia  . REFRACTIVE SURGERY     left eye  . TONSILLECTOMY     HPI:  Adam Chambers is a 76yo man with PMH of tonsilar  cancer (s/p chemoXRT 2011), esophageal cancer s/p laryngopharyngectomy (salvage), who presented for AMS and generalized weakness. Diagnosed in November 2015 with esophageal cancer. He underwent multiple series of cryotherapy. However, he had recurrence and underwent cryotherapy again in 2017. On PET-CT on 09/06/16 he was found to have a large esophageal mass and avid mediastinal lymph nodes concerning for recurrence and metastasis. He therefore underwent salvage esophagectomy and laryngopharygectomy at [facility] on 6/22. After this surgery, he developed a pneumonia and was admitted to the hospital for about another week. They have only been home for 1 week here in [place].He has only intermittently been taking tube feeding and has started eating a little bit by mouth. He has had intermittent diarrhea that seems to be associated with food intake. Referred for swallowing evaluation.   Assessment / Plan / Recommendation Clinical Impression  Patient presents with minimal aspiration risk as he is s/p larynopharyngectomy with laryngostome. In the absence of a fistula, pt not at risk for aspirating PO. He declines PO today due to nausea. Recommend advancing diet as tolerated/ appropriate from a GI standpoint. SLP will follow acutely; may benefit from instrumental assessment (MBS) to visualize swallowing function s/p laryngopharyngectomy and esophageal ectomy. Of note today, pt requiring cues for over-articulation, rate of speech and placement of electrolarynx for intelligibility today. Recommend cognitive-linguistic assessment as pt would likely benefit from skilled ST to improve communication ability. Recommend Outpatient SLP upon d/c.  SLP Visit Diagnosis: Dysphagia, unspecified (R13.10)    Aspiration Risk  No limitations    Diet Recommendation Other (Comment) (advance as tolerated per GI)        Other  Recommendations Recommended Consults: Other (Comment);Consider GI evaluation  (ST Cognitive-linguistic evaluation ) Oral Care Recommendations: Oral care BID   Follow up Recommendations Outpatient SLP      Frequency and Duration min 1 x/week  2 weeks       Prognosis Prognosis for Safe Diet Advancement: Fair Barriers to Reach Goals: Severity of deficits;Other (Comment) (esophageal cancer, s/p esophageal ectomy)      Swallow Study   General Date of Onset: 03/17/17 HPI: Adam Chambers is a 76yo man with PMH of tonsilar cancer (s/p chemoXRT 2011), esophageal cancer s/p laryngopharyngectomy (salvage), who presented for AMS and generalized weakness. Diagnosed in November 2015 with esophageal cancer. He underwent multiple series of cryotherapy. However, he had recurrence and underwent cryotherapy again in 2017. On PET-CT on 09/06/16 he was found to have a large esophageal mass and avid mediastinal lymph nodes concerning for recurrence and metastasis. He therefore underwent salvage esophagectomy and laryngopharygectomy at [facility] on 6/22. After this surgery, he developed a pneumonia and was admitted to the hospital for about another week. They have only been home for 1 week here in [place].He has only intermittently been taking tube feeding and has started eating a little bit by mouth. He has had intermittent diarrhea that seems to be associated with food intake. Referred for swallowing evaluation. Type of Study: Bedside Swallow Evaluation Previous Swallow Assessment: extensive esophageal history, see HPI Diet Prior to this Study: PEG tube;NPO Respiratory Status: Trach Collar History of Recent Intubation: No Behavior/Cognition: Alert;Cooperative (flat affect) Oral Cavity Assessment: Within Functional Limits Oral Care Completed by SLP: No Oral Cavity - Dentition: Adequate natural dentition Vision: Functional for self-feeding Self-Feeding Abilities: Refused PO Patient Positioning: Upright in chair Baseline Vocal Quality: Aphonic;Other (comment) (uses  electrolarynx) Volitional Cough: Other (Comment) (n/a)    Oral/Motor/Sensory Function Overall Oral Motor/Sensory Function: Within functional limits   Ice Chips Ice chips: Not tested   Thin Liquid Thin Liquid: Not tested    Nectar Thick Nectar Thick Liquid: Not tested   Honey Thick Honey Thick Liquid: Not tested   Puree Puree: Not tested   Solid   GO   Solid: Not tested        Aliene Altes 03/18/2017,4:51 PM  Deneise Lever, [place], Cool Pathologist [phone]

## 2017-03-18 NOTE — Progress Notes (Signed)
   Subjective: Mr. Bogard was seen laying in his room with wife at bedside. He mouthed his concerns and the remaining part of the history was communicated by his wife. The patient has been having diarrhea on and off for the past week with his last episode of diarrhea being this Tuesday. The patient states that he has been extremely fatigued and has not been able to stand for more than a few seconds at a time. He has also been having lots of secretions that look green in color and thinks they are possibly from his GI tract.  Objective:  Vital signs in last 24 hours: Vitals:   03/18/17 0457 03/18/17 0502 03/18/17 0504 03/18/17 0810  BP: (!) 164/127 (!) 166/97 (!) 170/100 (!) 162/98  Pulse: 82 76 85 79  Resp: 12 (!) 0 18 18  Temp: (!) 97.5 F (36.4 C)   98 F (36.7 C)  TempSrc: Axillary   Oral  SpO2: 91% 95% 96% 100%  Weight:        Physical Exam  Constitutional: He appears cachectic.  HENT:  Head: Normocephalic.  Cardiovascular: Normal rate, regular rhythm, normal heart sounds and intact distal pulses.   Pulmonary/Chest: Effort normal and breath sounds normal. No respiratory distress. He has no wheezes.  Abdominal: Soft. There is no tenderness. There is no rigidity.  G tube present in LLQ  Neurological: He is alert.  Skin: No rash noted. No erythema.  Psychiatric: He has a normal mood and affect.    Assessment/Plan:  Generalized weakness/AMS S/P Salvage Esophagectomy and Laryngopharygectomy on 6/22 The patient's wife was unable to wake the patient on 7/27 and therefore brought him to the hospital, however he was back to his baseline when seen by the IMTS team. The patient's neurological status is likely secondary to weakness from his recent surgery and also possibly due to somnolence from his hypothyroidism.  -Nutritional services consult -Will need to be oriented to prevent hospital acquired delirium -Discontinue  -PT/OT consult   Dumping Syndrome Diarrhea, now with  constipation Hyponatremia Hypokalemia In the setting of a recent surgery the patient has had GI distress. The patient has had diarrhea for the past week with the last episode being Tuesday, July 24th. The patient states that he has been having tube feeding intermittently and has also started to eat po. The patient's diarrhea is likely causing his hypokalemia and hyponatremia based on bmp (7/28) k=3.8, na=126, mg=1.9. -Nutritional services consult. Patient will likely benefit from smaller portion sizes and increased frequency of meals -Miralax ordered -octreotide can be considered if patient is refractory to dietary modifications -given KCl 2meq  Hypothyroidism The patient has had longstanding hypothyroidism which he has been managed with levothyroxine 50mcg 1tablet qd. The patient's wife mentioned that the patient has been taking the medication intermittently. It is also likely that the patient has malabsorption and therefore the oral medication maybe ineffective for him at the moment. Will consider alternate route if GI issues are not resolved. TSH=86.8 -T4, free ordered  HTN Blood pressure over past 24 hrs 129-170/94-100 -Continue home amlodipine 10mg  daily  CHF -Continue metoprolol 100mg  qd po for presumed chf  Dispo: Anticipated discharge in approximately 1-2 days.   Lars Mage, MD Internal Medicine PGY1 Pager:[phone] 03/18/2017, 10:43 AM

## 2017-03-18 NOTE — ED Notes (Signed)
Hospital bed requested.

## 2017-03-19 LAB — PROCALCITONIN: PROCALCITONIN: 0.21 ng/mL

## 2017-03-19 LAB — MAGNESIUM
Magnesium: 2 mg/dL (ref 1.7–2.4)
Magnesium: 2 mg/dL (ref 1.7–2.4)

## 2017-03-19 LAB — GLUCOSE, CAPILLARY
GLUCOSE-CAPILLARY: 118 mg/dL — AB (ref 65–99)
Glucose-Capillary: 102 mg/dL — ABNORMAL HIGH (ref 65–99)
Glucose-Capillary: 106 mg/dL — ABNORMAL HIGH (ref 65–99)
Glucose-Capillary: 108 mg/dL — ABNORMAL HIGH (ref 65–99)
Glucose-Capillary: 80 mg/dL (ref 65–99)
Glucose-Capillary: 95 mg/dL (ref 65–99)

## 2017-03-19 LAB — PHOSPHORUS
Phosphorus: 2.2 mg/dL — ABNORMAL LOW (ref 2.5–4.6)
Phosphorus: 2 mg/dL — ABNORMAL LOW (ref 2.5–4.6)

## 2017-03-19 LAB — BASIC METABOLIC PANEL
Anion gap: 6 (ref 5–15)
BUN: 9 mg/dL (ref 6–20)
CHLORIDE: 92 mmol/L — AB (ref 101–111)
CO2: 33 mmol/L — AB (ref 22–32)
CREATININE: 0.96 mg/dL (ref 0.61–1.24)
Calcium: 8.1 mg/dL — ABNORMAL LOW (ref 8.9–10.3)
GFR calc Af Amer: >60 mL/min (ref >60)
GFR calc non Af Amer: >60 mL/min (ref >60)
Glucose, Bld: 86 mg/dL (ref 65–99)
Potassium: 3.6 mmol/L (ref 3.5–5.1)
Sodium: 131 mmol/L — ABNORMAL LOW (ref 135–145)

## 2017-03-19 MED ORDER — SODIUM CHLORIDE 0.9 % IV SOLN
INTRAVENOUS | Status: AC
  Administered 2017-03-19 (×2): via INTRAVENOUS

## 2017-03-19 NOTE — Progress Notes (Signed)
   Subjective: He is feeling a lot better today but continue to have a frequent, productive cough that makes his chest hurt sometimes. This produces greenish appearing sputum. He got out of bed with physical therapy but needed assistance with getting to his chair or walking any steps due to low endurance and instability.  Objective:  Vital signs in last 24 hours: Vitals:   03/19/17 1058 03/19/17 1059 03/19/17 1200 03/19/17 1226  BP: 131/84  126/86   Pulse:  71 (!) 59 64  Resp:   16 12  Temp:   98.6 F (37 C)   TempSrc:   Oral   SpO2:   100% 100%  Weight:        Physical Exam  Constitutional: He appears cachectic. No distress.  HENT:  Head: Normocephalic and atraumatic.  Cardiovascular: Normal rate, regular rhythm, normal heart sounds and intact distal pulses.   Pulmonary/Chest: Effort normal and breath sounds normal. No respiratory distress. He has no wheezes.  Abdominal: Soft. There is no tenderness. There is no rigidity and no guarding.  G tube present in LLQ  Musculoskeletal: He exhibits no edema.    Assessment/Plan: Metabolic encephalopathy S/P Salvage Esophagectomy and Laryngopharygectomy on 6/22 He was initially nearly unresponsive and lethargic, possibly due to worsening hyponatremia or hypothyroidism due to low J-tube intake since surgery, GI losses, and malabsorption. His mental status is very clear today and he is conversational with electrolarynx. -Seems to be improved at this time -We will continue managing his electrolyte and thyroid derangements -PT/OT for continued strengthening, he wants to go home and HHPT would be beneficial for him  Dumping Syndrome Hyponatremia Hypokalemia Electrolytes are partially corrected today with slow tube feeds and continued IV fluids. Diarrhea has not recurred yet with slow TF. He is still hyponatremic and considering the difficulty of his enteric absorption would be better corrected under monitoring prior to discharge. His energy  level and mental clarity are also improving. -Nutritional services recommendations on feeding tube management are appreciated -Continue IVF today  Hypothyroidism Low free T4 consistent with hypothyroidism. It is unclear if this was contributory to his encephalopathy but continue administration via tube. Needs rechecking after getting back on daily dose for at least 6-8 weeks.  Protein-calorie malnutrition Albumin of 2.3 is consistent with appearance and malabsorption/intolerance of food intake. His SCr of 0.96 is now probably closer to his new baseline due to substantial muscle loss.  Dispo: Anticipated discharge in approximately 1 days.   Collier Salina, MD PGY-III Internal Medicine Resident Pager# [phone] 03/19/2017, 12:30 PM

## 2017-03-19 NOTE — Progress Notes (Signed)
  Date: 03/19/2017  Patient name: Adam Chambers.  Medical record number: [id number]  Date of birth: 1941/07/04   This patient's plan of care was discussed with the house staff. Please Dr. Marveen Reeks their note for complete details. I concur with his findings.   Sid Falcon, MD 03/19/2017, 2:42 PM

## 2017-03-20 ENCOUNTER — Encounter (HOSPITAL_COMMUNITY): Payer: Self-pay | Admitting: *Deleted

## 2017-03-20 ENCOUNTER — Inpatient Hospital Stay (HOSPITAL_COMMUNITY): Payer: Non-veteran care

## 2017-03-20 DIAGNOSIS — J189 Pneumonia, unspecified organism: Secondary | ICD-10-CM

## 2017-03-20 DIAGNOSIS — E871 Hypo-osmolality and hyponatremia: Secondary | ICD-10-CM

## 2017-03-20 DIAGNOSIS — C099 Malignant neoplasm of tonsil, unspecified: Secondary | ICD-10-CM

## 2017-03-20 DIAGNOSIS — E46 Unspecified protein-calorie malnutrition: Secondary | ICD-10-CM

## 2017-03-20 DIAGNOSIS — Z930 Tracheostomy status: Secondary | ICD-10-CM

## 2017-03-20 DIAGNOSIS — I10 Essential (primary) hypertension: Secondary | ICD-10-CM

## 2017-03-20 LAB — GLUCOSE, CAPILLARY
GLUCOSE-CAPILLARY: 84 mg/dL (ref 65–99)
GLUCOSE-CAPILLARY: 94 mg/dL (ref 65–99)
Glucose-Capillary: 109 mg/dL — ABNORMAL HIGH (ref 65–99)
Glucose-Capillary: 124 mg/dL — ABNORMAL HIGH (ref 65–99)
Glucose-Capillary: 132 mg/dL — ABNORMAL HIGH (ref 65–99)

## 2017-03-20 LAB — BASIC METABOLIC PANEL
Anion gap: 5 (ref 5–15)
BUN: 5 mg/dL — AB (ref 6–20)
CALCIUM: 8.1 mg/dL — AB (ref 8.9–10.3)
CHLORIDE: 93 mmol/L — AB (ref 101–111)
CO2: 32 mmol/L (ref 22–32)
CREATININE: 0.79 mg/dL (ref 0.61–1.24)
GFR calc non Af Amer: >60 mL/min (ref >60)
Glucose, Bld: 90 mg/dL (ref 65–99)
Potassium: 3.4 mmol/L — ABNORMAL LOW (ref 3.5–5.1)
SODIUM: 130 mmol/L — AB (ref 135–145)

## 2017-03-20 MED ORDER — VITAL 1.5 CAL PO LIQD
1000.0000 mL | ORAL | Status: AC
  Administered 2017-03-23: 1000 mL
  Filled 2017-03-20 (×8): qty 1000

## 2017-03-20 MED ORDER — VITAL 1.5 CAL PO LIQD
1000.0000 mL | ORAL | Status: DC
  Filled 2017-03-20 (×2): qty 1000

## 2017-03-20 MED ORDER — POTASSIUM CHLORIDE 20 MEQ PO PACK
40.0000 meq | PACK | Freq: Once | ORAL | Status: DC
Start: 2017-03-20 — End: 2017-03-22
  Filled 2017-03-20: qty 2

## 2017-03-20 MED ORDER — METOPROLOL TARTRATE 50 MG PO TABS
50.0000 mg | ORAL_TABLET | Freq: Two times a day (BID) | ORAL | Status: DC
  Administered 2017-03-20 – 2017-03-27 (×15): 50 mg via ORAL
  Filled 2017-03-20 (×16): qty 1

## 2017-03-20 MED ORDER — SODIUM CHLORIDE 0.9 % IV SOLN
INTRAVENOUS | Status: AC
  Administered 2017-03-20: 17:00:00 via INTRAVENOUS

## 2017-03-20 MED ORDER — VITAL 1.5 CAL PO LIQD
1000.0000 mL | ORAL | Status: AC
  Administered 2017-03-20: 1000 mL
  Filled 2017-03-20 (×2): qty 1000

## 2017-03-20 NOTE — Progress Notes (Signed)
Physical Therapy Treatment Patient Details Name: Adam Chambers. MRN: [id number] DOB: [birth year] Today's Date: 03/20/2017    History of Present Illness Adam Chambers is a 76yo man with PMH of esophageal cancer s/p laryngopharyngectomy (salvage), who presented for AMS and generalized weakness.     PT Comments    Continuing work on functional mobility and activity tolerance;  Noting much improved initial stnading stbiltiy with use of RW; abel to walk household distance with minguard assist and RW; Pt very much wants to dc home; his wife is concerned that if he goes home he will not get up and move and exercise like he should; Still, he is progressing, and there is every indicator that his functional mobility and activity tolerance will improve;  Prior to November, he was walking 5 miles a day; he seems flat and unmotivated; Required lots of encouragement to participate -- I'm hoping HHPT and HHOT will help with that at home;   Consider screening Adam Chambers for depression?   Follow Up Recommendations  Home health PT;Supervision/Assistance - 24 hour (HHOT as well)     Equipment Recommendations  Rolling walker with 5" wheels;3in1 (PT)    Recommendations for Other Services       Precautions / Restrictions Precautions Precautions: Fall    Mobility  Bed Mobility Overal bed mobility: Needs Assistance Bed Mobility: Supine to Sit     Supine to sit: Min guard     General bed mobility comments: Minguard assist mostly for line/leads; not needing physical assist to come to sit  Transfers Overall transfer level: Needs assistance Equipment used: Rolling walker (2 wheeled) Transfers: Sit to/from Stand Sit to Stand: Min assist;+2 safety/equipment         General transfer comment: Cues for hand placement, safety and control; better stability with standing; good use of RW for stability  Ambulation/Gait Ambulation/Gait assistance: Min guard;+2 safety/equipment (lines and chair  push) Ambulation Distance (Feet): 110 Feet Assistive device: Rolling walker (2 wheeled) Gait Pattern/deviations: Step-through pattern;Trunk flexed Gait velocity: slowed   General Gait Details: Cues for more upright posture and to self-monitor for activity tolerance   Stairs            Wheelchair Mobility    Modified Rankin (Stroke Patients Only)       Balance     Sitting balance-Leahy Scale: Fair       Standing balance-Leahy Scale: Poor (approaching Fair)                              Cognition Arousal/Alertness: Awake/alert Behavior During Therapy: WFL for tasks assessed/performed Overall Cognitive Status: Within Functional Limits for tasks assessed                                 General Comments: Pt's spouse reports mulitiple family members have been encouraging him to get out of bed and sit up in the chair at home. This was going on PTA as well. Pt withdrawn at times, flat affect. Family encouraging pt to get up into chair.       Exercises Other Exercises Other Exercises: Scapular retraction with deep inspiration x 5 Other Exercises: Cervical retraction    General Comments        Pertinent Vitals/Pain Pain Assessment: 0-10 Pain Score: 8  Pain Location: bil shoulders and neck; also sore tailbone Pain Descriptors / Indicators: Aching Pain Intervention(s): RN  gave pain meds during session    Home Living                      Prior Function            PT Goals (current goals can now be found in the care plan section) Acute Rehab PT Goals Patient Stated Goal: to get strength back PT Goal Formulation: With patient Time For Goal Achievement: 04/01/17 Potential to Achieve Goals: Good Progress towards PT goals: Progressing toward goals    Frequency    Min 3X/week      PT Plan Current plan remains appropriate    Co-evaluation              AM-PAC PT "6 Clicks" Daily Activity  Outcome Measure   Difficulty turning over in bed (including adjusting bedclothes, sheets and blankets)?: A Little Difficulty moving from lying on back to sitting on the side of the bed? : A Little Difficulty sitting down on and standing up from a chair with arms (e.g., wheelchair, bedside commode, etc,.)?: A Little Help needed moving to and from a bed to chair (including a wheelchair)?: A Little Help needed walking in hospital room?: A Little Help needed climbing 3-5 steps with a railing? : A Lot 6 Click Score: 17    End of Session Equipment Utilized During Treatment: Gait belt Activity Tolerance: Patient tolerated treatment well Patient left: in chair;with call bell/phone within reach;with family/visitor present Nurse Communication: Mobility status PT Visit Diagnosis: Muscle weakness (generalized) (M62.81);Unsteadiness on feet (R26.81)     Time: 0300-9233 PT Time Calculation (min) (ACUTE ONLY): 38 min  Charges:  $Gait Training: 23-37 mins $Therapeutic Exercise: 8-22 mins                    G Codes:       Roney Marion, PT  Acute Rehabilitation Services Pager [phone] Office [place] 03/20/2017, 4:25 PM

## 2017-03-20 NOTE — Progress Notes (Signed)
Internal Medicine Attending:   I saw and examined the patient. I reviewed the Dr Thea Gist note and I agree with the resident's findings and plan as documented in the resident's note. Patient reports he wants to go home, however wife noting that she cannot take care of him alone.  PT and OT recommended home health as well as 24 hour supervision, unclear if this can be provided by wife at this time.  Overall electrolytes are improving, we suspect that his hypothryoidism is mostly due to missed medication, administration with food he is currently on 1.6 mcg/kg dose.  He continues to have lots of green secretions and requires frequent suctioning.  Overall it appears he is at high risk to bounce back to the hospital if discharged, I requested that he allow [place] to observe him at least one more day in the hospital before discharge.

## 2017-03-20 NOTE — Progress Notes (Signed)
Nutrition Follow-up  DOCUMENTATION CODES:   Severe malnutrition in context of chronic illness  INTERVENTION:   Tube Feeding:   Increase Vital 1.5 to rate of 45 ml/hr today; pt to continue to infuse for 15 hours daily  If tolerates, plan to increase to rate of 95 ml/hr tomorrow for 15 hours. Will re-assess plan on follow if pt with issues tolerating increase in TF rate today   NUTRITION DIAGNOSIS:   Malnutrition (Severe) related to chronic illness, cancer and cancer related treatments (dysphagia) as evidenced by severe depletion of body fat, severe depletion of muscle mass.  Being addressed via TF  GOAL:   Patient will meet greater than or equal to 90% of their needs  Progressing  MONITOR:   TF tolerance, Vent status, Diet advancement, Weight trends, I & O's, Skin  REASON FOR ASSESSMENT:   Consult Enteral/tube feeding initiation and management  ASSESSMENT:   76 yo male admitted with AMS, diarrhea with hypokalemia and hyponatremia. Pt with hx of esophageal cancer s/p esophagectomy (total per wife) and laryngopharyngectomy on 6/22 with hx of chemo/radiation, G-tube placement, tonsil cancer, CHF, HTN, chronic pain   Pt continues to have lots of secretions, green in color per report  Tolerating Vital 1.5 at rate of 20 ml/hr via J-tube; no diarrhea, in fact no BM since admission  UOP 1450 mL in 24 hours  Pt remains NPO at present time, SLP following  Labs: sodium 130, potassium 3.4, phosphorus 2.0, magnesium wdl, Creatnine wdl, CBGs 84-118 Meds: NS at 100 ml/hr,   Diet Order:  Diet NPO time specified  Skin:  Reviewed, no issues  Last BM:  7/25  Height:   Ht Readings from Last 1 Encounters:  03/20/17 6\' 4"  (1.93 m)    Weight:   Wt Readings from Last 1 Encounters:  03/20/17 145 lb 1 oz (65.8 kg)    Ideal Body Weight:  91.8 kg  BMI:  Body mass index is 17.66 kg/m.  Estimated Nutritional Needs:   Kcal:  2671-2458 kcals  Protein:  110-130 g  Fluid:   >/= 2.2 L  EDUCATION NEEDS:   Education needs addressed  Kerman Passey MS, RD, LDN [phone] Pager  [phone] Weekend/On-Call Pager

## 2017-03-20 NOTE — Progress Notes (Signed)
   Subjective: Adam Chambers was seen laying in his bed with Adam Chambers at bedside. He stated that he is doing well and would like to go home. The Adam Chambers who was at bedside stated that the patient has continued to have lots of secretions that are green in color. The patient states that he has been tolerating his diet well and he denied any sob, chest pain, or dizziness.   Objective:  Vital signs in last 24 hours: Vitals:   03/20/17 0909 03/20/17 1000 03/20/17 1200 03/20/17 1225  BP:   133/90   Pulse: 69  77 84  Resp: 12  10 (!) 21  Temp:   97.8 F (36.6 C)   TempSrc:   Oral   SpO2: 100%  99% 96%  Weight:  145 lb 1 oz (65.8 kg)    Height:  6\' 4"  (1.93 m)     Physical Exam  Constitutional: He appears well-developed and well-nourished. He appears listless. He appears cachectic.  HENT:  Head: Normocephalic and atraumatic.  Cardiovascular: Normal rate, regular rhythm, normal heart sounds and intact distal pulses.   Pulmonary/Chest: Effort normal and breath sounds normal. No respiratory distress. He has no wheezes.  Abdominal: Soft. He exhibits no distension. There is no tenderness.  G tube in LLQ  Neurological: He appears listless.  Skin: No rash noted. No erythema.    Assessment/Plan:  Principal Problem:   Altered mental status Active Problems:   Tonsil cancer ([place])   Hypertension   Protein malnutrition ([place])   Status post tracheostomy ([place])   Hyponatremia   Metabolic encephalopathy S/P Salvage Esophagectomy and Laryngopharygectomy on 6/22 Dumping Syndrome Hyponatremia Hypokalemia Protein-calorie malnutrition The patient continues to be slow to response with generalized weakness and has a lot of fatigue. These symptoms are likely due to low J-tube intake since surgery, malabsorption, hypothyroidism, and GI losses (vomiting and diarrhea). The patient's Na=130 and k=3.4 today 7/30. Alb=2.3 -Continue PT/OT -Continue vital diet  -Continue 0.9%nacl -The patient's chest x-ray on 03/18/17  was consistent with right upper lobe pneumonia. Repeat chest x-ray  Hypothyroidism The patient's TSH=86.9 and free Tr=0.49 which is consistent with hypothyroidism -Continue levothyroxine100mg  via tube -Recheck thyroid function outpatient   HTN The patient's blood pressure over the past 24hrs has been 126-159/86-100 -Continue amlodipine 10mg  qd and metoprolol 50mg  bid   Dispo: Anticipated discharge in approximately 1-2 day(s).   Lars Mage, MD Internal Medicine PGY1 Pager:[phone] 03/20/2017, 3:01 PM

## 2017-03-21 ENCOUNTER — Inpatient Hospital Stay (HOSPITAL_COMMUNITY): Payer: Non-veteran care

## 2017-03-21 DIAGNOSIS — K911 Postgastric surgery syndromes: Secondary | ICD-10-CM

## 2017-03-21 DIAGNOSIS — G9341 Metabolic encephalopathy: Secondary | ICD-10-CM

## 2017-03-21 DIAGNOSIS — E039 Hypothyroidism, unspecified: Secondary | ICD-10-CM

## 2017-03-21 DIAGNOSIS — E876 Hypokalemia: Secondary | ICD-10-CM

## 2017-03-21 LAB — GLUCOSE, CAPILLARY
GLUCOSE-CAPILLARY: 82 mg/dL (ref 65–99)
GLUCOSE-CAPILLARY: 90 mg/dL (ref 65–99)
Glucose-Capillary: 101 mg/dL — ABNORMAL HIGH (ref 65–99)
Glucose-Capillary: 121 mg/dL — ABNORMAL HIGH (ref 65–99)
Glucose-Capillary: 146 mg/dL — ABNORMAL HIGH (ref 65–99)
Glucose-Capillary: 134 mg/dL — ABNORMAL HIGH (ref 65–99)

## 2017-03-21 MED ORDER — IOPAMIDOL (ISOVUE-300) INJECTION 61%
INTRAVENOUS | Status: AC
  Filled 2017-03-21: qty 150

## 2017-03-21 MED ORDER — SODIUM CHLORIDE 0.9 % IV SOLN
INTRAVENOUS | Status: AC
  Administered 2017-03-21 – 2017-03-22 (×2): via INTRAVENOUS

## 2017-03-21 MED ORDER — IOPAMIDOL (ISOVUE-300) INJECTION 61%: 150.0000 mL | Freq: Once | INTRAVENOUS | Status: DC | PRN

## 2017-03-21 NOTE — Progress Notes (Signed)
PT Cancellation Note  Patient Details Name: Adam Chambers. MRN: [id number] DOB: [birth year]   Cancelled Treatment:    Reason Eval/Treat Not Completed: (P) Patient at procedure or test/unavailable, PT will follow up with pt tomorrow. Thanks.  Radford Pease B. Migdalia Dk PT, DPT Acute Rehabilitation  [phone] Pager [phone]  [place] 03/21/2017, 4:15 PM

## 2017-03-21 NOTE — Progress Notes (Signed)
   Subjective: Mr. Trefz was seen laying in his bed this morning with his wife at bedside. He states that he has a headache, his shoulders hurt, and he has back pain. The patient's wife stated that he continues to have increased secretions that are green in color.   Objective:  Vital signs in last 24 hours: Vitals:   03/21/17 0800 03/21/17 0824 03/21/17 0935 03/21/17 1218  BP:  (!) 141/87  (!) 143/85  Pulse:   89 72  Resp:    (!) 25  Temp: 98.4 F (36.9 C)     TempSrc: Oral     SpO2:    95%  Weight:      Height:       Physical Exam  Constitutional: He appears listless. He appears cachectic.  HENT:  Head: Normocephalic and atraumatic.  Eyes: Conjunctivae are normal.  Cardiovascular: Normal rate, regular rhythm, normal heart sounds and intact distal pulses.   No murmur heard. Pulmonary/Chest: Effort normal and breath sounds normal. No respiratory distress.  Increased secretions coming out of his stoma  Abdominal:  G tube present in LLQ  Neurological: He appears listless.    Assessment/Plan:  Metabolic encephalopathy S/P Salvage Esophagectomy and Laryngopharygectomy on 6/22 Dumping Syndrome Hyponatremia Hypokalemia Protein-calorie malnutrition The patient's generalized weakness and fatigue are likely due to low J-tube intake since surgery, malabsorption, hypothyroidism, and GI losses (vomiting and diarrhea).  -BMET tomorrow 8/1  -Continue PT/OT -Continue vital diet  -Continue 0.9%nacl -The patient's chest x-ray on 03/18/17 was consistent with right upper lobe pneumonia. Repeat chest x-ray on 03/20/17 showed infiltrate in the right upper lobe and left lung base. It is likely that this infiltrate is from previous aspiration pneumonia which has been treated already.  -Since the patient continues to have secretions in his stoma there was suspicion for a tracheoesophageal fistula. Barium swallow did not show any presence of a tracheoesophageal fistula.  -Case management to assist  with home health services  Hypothyroidism The patient's TSH=86.9 and free Tr=0.49 which is consistent with hypothyroidism -Continue levothyroxine100mg  via tube -Recheck thyroid function outpatient   HTN The patient's blood pressure over the past 24hrs has been 127-162/82-97 -Continue amlodipine 10mg  qd and metoprolol 50mg  bid  Dispo: Anticipated discharge in approximately 0-1 day(s).   Lars Mage, MD Internal Medicine PGY1 [phone] 03/21/2017, 6:06 PM

## 2017-03-21 NOTE — Progress Notes (Signed)
PT Cancellation Note  Patient Details Name: Adam Chambers. MRN: [id number] DOB: 06-28-1941   Cancelled Treatment:    Reason Eval/Treat Not Completed: (P) Patient declined, no reason specified Pt states he does not "feel good" but can not pin point a specific source of pain or discomfort. Pt refuses to work with PT at this time. PT told pt that we would check back the end of the day as able.   Benson Porcaro B. Migdalia Dk PT, DPT Acute Rehabilitation  [phone] Pager [phone]  [place] 03/21/2017, 2:19 PM

## 2017-03-21 NOTE — Significant Event (Signed)
Rapid Response Event Note  Overview: Time Called: 1615 Arrival Time: 1617 Event Type: Respiratory  Initial Focused Assessment: Patient in Radiology for testing.  Per staff his O2 sats decreased to the low 80% on his Trach Collar.  Patient sitting upright on fluro table, productive cough.  Poor air movement  Interventions: Suctioned via stoma,  Tan secretions Increased O2 to 80% Fio2 O2 sats improved to 96%. Transported back to Room [phone]) Patient alert and in no distress  Plan of Care (if not transferred): Continue to wean O2 as tolerated  Event Summary: Name of Physician Notified: Radiology MD at bedside at      at    Outcome: Stayed in room and stabalized  Event End Time: [place]  Raliegh Ip

## 2017-03-21 NOTE — Progress Notes (Signed)
Nutrition Follow-up  DOCUMENTATION CODES:   Severe malnutrition in context of chronic illness  INTERVENTION:   Tube Feeding:   Continue Vital 1.5 with plan to increase to goal of 95 ml/hr later today. Plan to continue 15 hour feedings (0600 to 2300) daily. Continue to assess tolerance. If unable to tolerate increase in rate, will reassess feeding regimen  Recommend rechecking potassium, phosphorus and magnesium and supplementing as needed   NUTRITION DIAGNOSIS:   Malnutrition (Severe) related to chronic illness, cancer and cancer related treatments (dysphagia) as evidenced by severe depletion of body fat, severe depletion of muscle mass.  Being addressed via TF  GOAL:   Patient will meet greater than or equal to 90% of their needs  Progressing  MONITOR:   TF tolerance, Vent status, Diet advancement, Weight trends, I & O's, Skin  REASON FOR ASSESSMENT:   Consult Enteral/tube feeding initiation and management  ASSESSMENT:   76 yo male admitted with AMS, diarrhea with hypokalemia and hyponatremia. Pt with hx of esophageal cancer s/p esophagectomy (total per wife) and laryngopharyngectomy on 6/22 with hx of chemo/radiation, G-tube placement, tonsil cancer, CHF, HTN, chronic pain   Pt with copious secretions via stoma, needing to be suctioned frequently  Tolerating Vital 1.5 @ 45 ml/hr, plan to increase to 95 ml/hr later today, continues to infuse for 15 hour period per day via J-tube  No diarrhea, no N/V or abdominal pain  Pt weight has been trending down since admission. Currently progressing TF to goal regimen to meet nutritional needs  Remains NPO, SLP following  Labs: CBGs 82-132, no BMP today Meds: reviewed  Diet Order:  Diet NPO time specified  Skin:  Reviewed, no issues  Last BM:  7/25  Height:   Ht Readings from Last 1 Encounters:  03/20/17 6\' 4"  (1.93 m)    Weight:   Wt Readings from Last 1 Encounters:  03/21/17 143 lb 4.8 oz (65 kg)     Ideal Body Weight:  91.8 kg  BMI:  Body mass index is 17.44 kg/m.  Estimated Nutritional Needs:   Kcal:  7867-6720 kcals  Protein:  110-130 g  Fluid:  >/= 2.2 L  EDUCATION NEEDS:   Education needs addressed  Kerman Passey MS, RD, LDN [phone] Pager  [phone] Weekend/On-Call Pager

## 2017-03-21 NOTE — Progress Notes (Signed)
  Speech Language Pathology Treatment: Dysphagia  Patient Details Name: Adam Chambers. MRN: [id number] DOB: 02/13/1941 Today's Date: 03/21/2017 Time: 2641-5830 SLP Time Calculation (min) (ACUTE ONLY): 29 min  Assessment / Plan / Recommendation Clinical Impression  SLP followed up for further needs. To reiterate: Pt is s/p laryngectomy, pharyngectomy, and esophagectomy. Given these surgeries he should not have any anatomical connection between his mouth and his airway as the larynx has been removed, thus aspiration should not be possible. Dysphagia is still possible as solids may not transit well through what is left of the esophagus. Despite this, pt is NPO and has not expressed any desire to eat of drink since before admission. This was attributed to diarrhea.  This session SLP and wife had to repeatedly ask pt to attempt sips of water which he finally accepted. 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  Suggest imaging for objective evaluation of esophageal function. Discussed with pt, wife and MD. Pt will stay NPO for now.    HPI HPI: Adam Chambers is a 76yo man with PMH of tonsilar cancer (s/p chemoXRT 2011), esophageal cancer s/p laryngopharyngectomy (salvage), who presented for AMS and generalized weakness. Diagnosed in November 2015 with esophageal cancer. He underwent multiple series of cryotherapy. However, he had recurrence and underwent cryotherapy again in 2017. On PET-CT on 09/06/16 he was found to have a large esophageal mass and avid mediastinal lymph nodes concerning for recurrence and metastasis. He therefore underwent salvage esophagectomy and laryngopharygectomy at [facility] on 6/22. After this surgery, he developed a pneumonia and was admitted to the hospital for about another week. They have only been home for 1 week here in [place].He has  only intermittently been taking tube feeding and has started eating a little bit by mouth. He has had intermittent diarrhea that seems to be associated with food intake. Referred for swallowing evaluation.      SLP Plan   (Barium swallow)       Recommendations  Diet recommendations: NPO                Plan:  (Barium swallow)       GO               Adam Baltimore, MA CCC-SLP [phone]  Adam Chambers, Katherene Ponto 03/21/2017, 9:53 AM

## 2017-03-21 NOTE — Care Management Note (Signed)
Case Management Note Marvetta Gibbons RN, BSN Unit 4E-Case Manager [phone]  Patient Details  Name: Adam Chambers. MRN: [id number] Date of Birth: 1941/01/21  Subjective/Objective:     Pt admitted with PNA/sepsis hx of chronic trach              Action/Plan: PTA pt lived at home with wife- referral for [facility] needs- spoke with pt and wife at bedside- per wife pt is on home TF- get their supplies from [place] along with medications- pt has PCP- DR. Woods at the [facility], has had procedures at [facility]. Pt has had [place] services in past with [facility]- per wife not active currently with any HH services- would want to look at different Adam Chambers agency - per PT/OT recommendations for [facility] - however wife is concerned about taking pt home due to frequency of suctioning- explained to wife- that if pt is needing frequent trach suctioning a SNF would not accept pt either- MD to order test to see about cause for secretions. CM to follow for d/c needs- anticipate return home with wife with [facility] services- will need orders for [facility] prior to discharge.   Expected Discharge Date:                  Expected Discharge Plan:  [place]  In-House Referral:     Discharge planning Services  CM Consult  Post Acute Care Choice:  Home Health Choice offered to:  Spouse  DME Arranged:    DME Agency:     HH Arranged:    [place] Agency:     Status of Service:  In process, will continue to follow  If discussed at Long Length of Stay Meetings, dates discussed:    Discharge Disposition:   Additional Comments:  Dawayne Patricia, RN 03/21/2017, 3:43 PM

## 2017-03-21 NOTE — Progress Notes (Signed)
Internal Medicine Attending:   I saw and examined the patient. I reviewed the resident's note and I agree with the resident's findings and plan as documented in the resident's note. Wife at bedside, overall still requiring frequent suctioning of greenish material.  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  We proceeded with barium swallow which did not identify any fistua, however he continues to require frequent suctioning.  Overall I do not have another great explanation for his copious amounts of tracheotomy site secretions.  He was recently treated for aspiration PNA and still has some radiographic evidence of infiltrates, he did not have any leukocytosis on admission and vitals have been grossly stable, we may need to consider pulm consultation tomorrow for bronchoscopy.

## 2017-03-22 LAB — CULTURE, BLOOD (ROUTINE X 2)
Culture: NO GROWTH
Culture: NO GROWTH
SPECIAL REQUESTS: ADEQUATE
SPECIAL REQUESTS: ADEQUATE

## 2017-03-22 LAB — BASIC METABOLIC PANEL
ANION GAP: 7 (ref 5–15)
BUN: 8 mg/dL (ref 6–20)
CO2: 33 mmol/L — AB (ref 22–32)
Calcium: 8 mg/dL — ABNORMAL LOW (ref 8.9–10.3)
Chloride: 91 mmol/L — ABNORMAL LOW (ref 101–111)
Creatinine, Ser: 1.04 mg/dL (ref 0.61–1.24)
GFR calc Af Amer: >60 mL/min (ref >60)
GLUCOSE: 117 mg/dL — AB (ref 65–99)
POTASSIUM: 3.4 mmol/L — AB (ref 3.5–5.1)
Sodium: 131 mmol/L — ABNORMAL LOW (ref 135–145)

## 2017-03-22 LAB — PHOSPHORUS: Phosphorus: 1.8 mg/dL — ABNORMAL LOW (ref 2.5–4.6)

## 2017-03-22 LAB — GLUCOSE, CAPILLARY
GLUCOSE-CAPILLARY: 145 mg/dL — AB (ref 65–99)
GLUCOSE-CAPILLARY: 126 mg/dL — AB (ref 65–99)
GLUCOSE-CAPILLARY: 89 mg/dL (ref 65–99)
GLUCOSE-CAPILLARY: 138 mg/dL — AB (ref 65–99)
Glucose-Capillary: 102 mg/dL — ABNORMAL HIGH (ref 65–99)
Glucose-Capillary: 72 mg/dL (ref 65–99)

## 2017-03-22 LAB — MAGNESIUM: Magnesium: 1.6 mg/dL — ABNORMAL LOW (ref 1.7–2.4)

## 2017-03-22 MED ORDER — MAGNESIUM SULFATE 2 GM/50ML IV SOLN
2.0000 g | Freq: Once | INTRAVENOUS | Status: AC
  Administered 2017-03-22: 2 g via INTRAVENOUS
  Filled 2017-03-22: qty 50

## 2017-03-22 MED ORDER — POTASSIUM PHOSPHATES 15 MMOLE/5ML IV SOLN
20.0000 mmol | Freq: Once | INTRAVENOUS | Status: AC
  Administered 2017-03-22: 20 mmol via INTRAVENOUS
  Filled 2017-03-22: qty 6.67

## 2017-03-22 MED ORDER — POTASSIUM CHLORIDE 20 MEQ/15ML (10%) PO SOLN
40.0000 meq | Freq: Once | ORAL | Status: AC
  Administered 2017-03-22: 40 meq
  Filled 2017-03-22: qty 30

## 2017-03-22 NOTE — Progress Notes (Signed)
Internal Medicine Attending  Date: 03/22/2017  Patient name: Adam Chambers. Medical record number: [id number] Date of birth: April 23, 1941 Age: 76 y.o. Gender: male  I saw and evaluated the patient. I reviewed the resident's note by Dr. Charlynn Grimes and I agree with the resident's findings and plans as documented in his progress note.

## 2017-03-22 NOTE — Progress Notes (Signed)
PT Cancellation Note  Patient Details Name: Adam Chambers. MRN: [id number] DOB: 22-Jul-1941   Cancelled Treatment:    Reason Eval/Treat Not Completed: Patient declined, no reason specified Pt very agitated when asked to participate in therapy. Family member present and attempting to motivate pt. Pt adamantly refused mobility (despite education on benefits of OOB mobility and encouragement) and reported "i'm only gonna tell you once. I'm not getting up". PT will continue to follow acutely.    Salina April, PTA Pager: [phone]   03/22/2017, 9:10 AM

## 2017-03-22 NOTE — Progress Notes (Signed)
Physical Therapy Treatment Patient Details Name: Adam Chambers. MRN: [id number] DOB: 23-Dec-1940 Today's Date: 03/22/2017    History of Present Illness Adam Chambers is a 76yo man with PMH of esophageal cancer s/p laryngopharyngectomy (salvage), who presented for AMS and generalized weakness.     PT Comments    Patient is making gradual progress with mobility. Continue to progress as tolerated with anticipated d/c home with HHPT.    Follow Up Recommendations  Home health PT;Supervision/Assistance - 24 hour     Equipment Recommendations  Rolling walker with 5" wheels;3in1 (PT)    Recommendations for Other Services       Precautions / Restrictions Precautions Precautions: Fall Restrictions Weight Bearing Restrictions: No    Mobility  Bed Mobility               General bed mobility comments: Pt OOB in chair upon arrival  Transfers Overall transfer level: Needs assistance Equipment used: Rolling walker (2 wheeled) Transfers: Sit to/from Stand Sit to Stand: Min guard         General transfer comment: min guard for safety; cues for safe hand placement  Ambulation/Gait Ambulation/Gait assistance: Min guard;+2 safety/equipment Ambulation Distance (Feet): 140 Feet Assistive device: Rolling walker (2 wheeled) Gait Pattern/deviations: Step-through pattern;Trunk flexed;Decreased step length - right;Decreased step length - left;Decreased stride length;Narrow base of support Gait velocity: decreased   General Gait Details: cues for posture and forward gaze; pt maintained flexed cervical spine most of session   Stairs            Wheelchair Mobility    Modified Rankin (Stroke Patients Only)       Balance Overall balance assessment: Needs assistance Sitting-balance support: Feet supported Sitting balance-Leahy Scale: Fair     Standing balance support: Bilateral upper extremity supported Standing balance-Leahy Scale: Poor Standing balance comment: RW for  support                            Cognition Arousal/Alertness: Awake/alert Behavior During Therapy: Flat affect Overall Cognitive Status: Within Functional Limits for tasks assessed                                 General Comments: needs max encouragement to participate      Exercises General Exercises - Lower Extremity Long Arc Quad: AROM;Both;15 reps Hip Flexion/Marching: AROM;Both;15 reps Other Exercises Other Exercises: Pt completed UB weighted AROM exercises x10 biceps and shoulder bilaterally    General Comments General comments (skin integrity, edema, etc.): pt encouraged to perform UE/LE therex throughout the day and ambulate with nursing staff       Pertinent Vitals/Pain Pain Assessment: Faces Faces Pain Scale: Hurts a little bit Pain Location: unspecified Pain Intervention(s): Monitored during session    Home Living                      Prior Function            PT Goals (current goals can now be found in the care plan section) Acute Rehab PT Goals Patient Stated Goal: to get strength back Progress towards PT goals: Progressing toward goals    Frequency    Min 3X/week      PT Plan Current plan remains appropriate    Co-evaluation PT/OT/SLP Co-Evaluation/Treatment: Yes Reason for Co-Treatment: Complexity of the patient's impairments (multi-system involvement);Other (comment) (activity tolerance) PT goals  addressed during session: Mobility/safety with mobility;Strengthening/ROM OT goals addressed during session: Strengthening/ROM      AM-PAC PT "6 Clicks" Daily Activity  Outcome Measure  Difficulty turning over in bed (including adjusting bedclothes, sheets and blankets)?: A Little Difficulty moving from lying on back to sitting on the side of the bed? : A Little Difficulty sitting down on and standing up from a chair with arms (e.g., wheelchair, bedside commode, etc,.)?: A Little Help needed moving to and from  a bed to chair (including a wheelchair)?: A Little Help needed walking in hospital room?: A Little Help needed climbing 3-5 steps with a railing? : A Lot 6 Click Score: 17    End of Session Equipment Utilized During Treatment: Gait belt Activity Tolerance: Patient tolerated treatment well Patient left: in chair;with call bell/phone within reach;with family/visitor present Nurse Communication: Mobility status PT Visit Diagnosis: Muscle weakness (generalized) (M62.81);Unsteadiness on feet (R26.81)     Time: 1607-3710 PT Time Calculation (min) (ACUTE ONLY): 25 min  Charges:  $Gait Training: 8-22 mins                    G Codes:       Earney Navy, PTA Pager: [phone]     Darliss Cheney 03/22/2017, 3:55 PM

## 2017-03-22 NOTE — Progress Notes (Addendum)
  Speech Language Pathology Treatment: Dysphagia  Patient Details Name: Kenyatta Keidel. MRN: [id number] DOB: Dec 03, 1940 Today's Date: 03/22/2017 Time: 4235-3614 SLP Time Calculation (min) (ACUTE ONLY): 66 min  Assessment / Plan / Recommendation Clinical Impression  Assisted pt with oral care and stayed during MD visit. Esophagram report states no finding of tracheal-esophageal fistula, though it could not be excluded. Noted in images a pooling of contrast at the cervical spine that was not addressed in the report and discussed this with the physicians. During 30 minutes in the room pt did not cough or excpetorate at all. However, after physicians left SLP and wife repeatedly asked pt to attempt sips of cranberry juice to see if it could be tolerated. Immediately following sips, coughing occurred with expectoration initially of thick secretions, then with further sips, thinner secretions with questionable pink tinge. Pt would not take more than 3 sips. Wife reported that prior to admit she suctioned tomato soup from stoma, enough that it stained the suction catheter red. Considering encouraging pt to attempt sips with heavy food coloring. Feel that assist from ENT is needed to evaluate anatomy and function based on inconsistent findings. Discussed with MDs. Will follow as needed.  Herbie [place], [place] CCC-SLP [phone]    HPI HPI: Mr. Macnaughton is a 76yo man with PMH of tonsilar cancer (s/p chemoXRT 2011), esophageal cancer s/p laryngopharyngectomy (salvage), who presented for AMS and generalized weakness. Diagnosed in November 2015 with esophageal cancer. He underwent multiple series of cryotherapy. However, he had recurrence and underwent cryotherapy again in 2017. On PET-CT on 09/06/16 he was found to have a large esophageal mass and avid mediastinal lymph nodes concerning for recurrence and metastasis. He therefore underwent salvage esophagectomy and laryngopharygectomy at [facility] on 6/22. After this surgery, he developed a pneumonia and was admitted to the hospital for about another week. They have only been home for 1 week here in [place].He has only intermittently been taking tube feeding and has started eating a little bit by mouth. He has had intermittent diarrhea that seems to be associated with food intake. Referred for swallowing evaluation.      SLP Plan  Continue with current plan of care       Recommendations  Diet recommendations: NPO                Oral Care Recommendations: Oral care QID Plan: Continue with current plan of care       [facility] Braidyn Peace, MA CCC-SLP [phone]  Lynann Beaver 03/22/2017, 10:50 AM

## 2017-03-22 NOTE — Care Management Important Message (Signed)
Important Message  Patient Details  Name: Adam Chambers. MRN: [id number] Date of Birth: 1941-07-02   Medicare Important Message Given:  Yes    Nathen May 03/22/2017, 12:27 PM

## 2017-03-22 NOTE — Progress Notes (Signed)
   Subjective:  Adam Chambers was doing better today. Wife reports that he had a much better night last night. Required little suctioning. No complaints of pain today. Did not want to work with PT this morning, stressed the importance of working with them. Tolerating the tube feeds. No nausea, vomiting, diarrhea. Denies any respiratory complaints.   Objective:  Vital signs in last 24 hours: Vitals:   03/22/17 1100 03/22/17 1131 03/22/17 1154 03/22/17 1648  BP: 94/66   122/73  Pulse:   66 66  Resp:    20  Temp: 98.3 F (36.8 C) 98.3 F (36.8 C)  98.8 F (37.1 C)  TempSrc: Oral Oral  Oral  SpO2:    96%  Weight:      Height:       Constitutional: Chronically ill appearing elderly gentleman. No acute distress. Cachetic appearing with muscle wasting. Cardiovascular: RRR, no m/r/g  Pulmonary/Chest: CTAB. Stoma site clean and dry. Thick sputum secretions with coughing. Abdominal:  G tube in LLQ. BS+. Soft, non-tender.  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 Barium swallow yesterday with no fistula identified. Spoke with speech pathologist today who was concerned for possible area of pooling on the barium swallow. Difficult to assess given his abnormal anatomy from surgery. May represent negative space from his surgery. Had difficulty and coughing following thin liquids. Questionable pink tinged thin secretions from stoma following drinking cranberry juice. Wife reported suctioning tomato soup from stoma at home prior to admission. Will consult ENT for further evaluation. Appreciate their assistance.  S/P Salvage Esophagectomy and Laryngopharygectomy on 6/22 Dumping Syndrome Hyponatremia Hypokalemia Hypophosphatemia Hypomagnasemia Protein-calorie malnutrition Tolerating tube feeds without any diarrhea. Nutrition increased rate today. Phos 1.8 today. Replacing with KPhos to avoid refeeding  syndrome. Recheck in am. Replete K and Mag. Na stable at 131 today.  -Renal function panel, Mg in am -Nutrition consulted, appreciate assistance -Continue PT/OT   Hypothyroidism The patient's TSH=86.9 and free Tr=0.49 which is consistent with hypothyroidism -Continue levothyroxine100mg  via tube -Recheck thyroid function outpatient   HTN BP controlled -Continue amlodipine 10mg  qd and metoprolol 50mg  bid  Dispo: Anticipated discharge in approximately 2-3 day(s).   Maryellen Pile, MD 03/22/2017, 6:48 PM Pager: [phone]

## 2017-03-22 NOTE — Progress Notes (Signed)
Occupational Therapy Treatment Patient Details Name: Adam Chambers. MRN: [id number] DOB: 1941-08-22 Today's Date: 03/22/2017    History of present illness [name] is a 76yo man with PMH of esophageal cancer s/p laryngopharyngectomy (salvage), who presented for AMS and generalized weakness.    OT comments  Pt tolerated increased activity and exercise today but requires max encouragement for participation due to fatigue. Pt able to perform functional mobility today with min guard assist with use of RW. Pt completed UB weighted exercises x10 each. D/c plan remains appropriate. Will continue to follow acutely.   Follow Up Recommendations  Home health OT;Supervision/Assistance - 24 hour    Equipment Recommendations  3 in 1 bedside commode    Recommendations for Other Services      Precautions / Restrictions Precautions Precautions: Fall Restrictions Weight Bearing Restrictions: No       Mobility Bed Mobility               General bed mobility comments: Pt OOB in chair upon arrival  Transfers Overall transfer level: Needs assistance Equipment used: Rolling walker (2 wheeled) Transfers: Sit to/from Stand Sit to Stand: Min guard         General transfer comment: for safety, increased time required    Balance Overall balance assessment: Needs assistance Sitting-balance support: Feet supported Sitting balance-Leahy Scale: Fair     Standing balance support: Bilateral upper extremity supported Standing balance-Leahy Scale: Poor Standing balance comment: RW for support                           ADL either performed or assessed with clinical judgement   ADL Overall ADL's : Needs assistance/impaired                         Toilet Transfer: Min guard;Ambulation;RW Toilet Transfer Details (indicate cue type and reason): Simulated by sit to stand from chair with functional mobility in room         Functional mobility during ADLs: Min  guard;Rolling walker       Vision       Perception     Praxis      Cognition Arousal/Alertness: Awake/alert Behavior During Therapy: Flat affect Overall Cognitive Status: Within Functional Limits for tasks assessed                                          Exercises Exercises: Other exercises Other Exercises Other Exercises: Pt completed UB weighted AROM exercises x10 biceps and shoulder bilaterally   Shoulder Instructions       General Comments      Pertinent Vitals/ Pain       Pain Assessment: Faces Faces Pain Scale: Hurts a little bit Pain Intervention(s): Monitored during session;Limited activity within patient's tolerance  Home Living                                          Prior Functioning/Environment              Frequency  Min 2X/week        Progress Toward Goals  OT Goals(current goals can now be found in the care plan section)  Progress towards OT goals: Progressing toward goals  Acute Rehab OT Goals Patient Stated Goal: to get strength back OT Goal Formulation: With patient/family  Plan Discharge plan remains appropriate    Co-evaluation    PT/OT/SLP Co-Evaluation/Treatment: Yes Reason for Co-Treatment: Complexity of the patient's impairments (multi-system involvement) (activity tolerance)   OT goals addressed during session: Strengthening/ROM      AM-PAC PT "6 Clicks" Daily Activity     Outcome Measure   Help from another person eating meals?: Total Help from another person taking care of personal grooming?: A Little Help from another person toileting, which includes using toliet, bedpan, or urinal?: A Little Help from another person bathing (including washing, rinsing, drying)?: A Little Help from another person to put on and taking off regular upper body clothing?: A Little Help from another person to put on and taking off regular lower body clothing?: A Little 6 Click Score: 16    End  of Session Equipment Utilized During Treatment: Gait belt;Rolling walker  OT Visit Diagnosis: Muscle weakness (generalized) (M62.81);Pain;Other abnormalities of gait and mobility (R26.89)   Activity Tolerance Patient tolerated treatment well;Patient limited by fatigue   Patient Left in chair;with call bell/phone within reach;with family/visitor present   Nurse Communication          Time: 5053-9767 OT Time Calculation (min): 25 min  Charges: OT General Charges $OT Visit: 1 Procedure OT Treatments $Therapeutic Exercise: 8-22 mins  Amarylis Rovito A. Ulice Brilliant, M.S., OTR/L Pager: [place] 03/22/2017, 3:32 PM

## 2017-03-23 DIAGNOSIS — T8183XA Persistent postprocedural fistula, initial encounter: Secondary | ICD-10-CM

## 2017-03-23 DIAGNOSIS — E878 Other disorders of electrolyte and fluid balance, not elsewhere classified: Secondary | ICD-10-CM

## 2017-03-23 DIAGNOSIS — T8183XS Persistent postprocedural fistula, sequela: Secondary | ICD-10-CM

## 2017-03-23 DIAGNOSIS — K9409 Other complications of colostomy: Secondary | ICD-10-CM

## 2017-03-23 DIAGNOSIS — R4182 Altered mental status, unspecified: Secondary | ICD-10-CM

## 2017-03-23 LAB — RENAL FUNCTION PANEL
Albumin: 2.1 g/dL — ABNORMAL LOW (ref 3.5–5.0)
Anion gap: 6 (ref 5–15)
BUN: 15 mg/dL (ref 6–20)
CHLORIDE: 92 mmol/L — AB (ref 101–111)
CO2: 32 mmol/L (ref 22–32)
Calcium: 8 mg/dL — ABNORMAL LOW (ref 8.9–10.3)
Creatinine, Ser: 0.86 mg/dL (ref 0.61–1.24)
GFR calc Af Amer: >60 mL/min (ref >60)
GFR calc non Af Amer: >60 mL/min (ref >60)
GLUCOSE: 102 mg/dL — AB (ref 65–99)
POTASSIUM: 4.4 mmol/L (ref 3.5–5.1)
Phosphorus: 2.3 mg/dL — ABNORMAL LOW (ref 2.5–4.6)
Sodium: 130 mmol/L — ABNORMAL LOW (ref 135–145)

## 2017-03-23 LAB — GLUCOSE, CAPILLARY
GLUCOSE-CAPILLARY: 107 mg/dL — AB (ref 65–99)
GLUCOSE-CAPILLARY: 151 mg/dL — AB (ref 65–99)
Glucose-Capillary: 90 mg/dL (ref 65–99)
Glucose-Capillary: 82 mg/dL (ref 65–99)
Glucose-Capillary: 135 mg/dL — ABNORMAL HIGH (ref 65–99)
Glucose-Capillary: 134 mg/dL — ABNORMAL HIGH (ref 65–99)

## 2017-03-23 LAB — MAGNESIUM: MAGNESIUM: 2.1 mg/dL (ref 1.7–2.4)

## 2017-03-23 MED ORDER — OXYCODONE HCL 5 MG/5ML PO SOLN
5.0000 mg | Freq: Once | ORAL | Status: AC
  Administered 2017-03-23: 5 mg

## 2017-03-23 MED ORDER — VITAL 1.5 CAL PO LIQD
1000.0000 mL | ORAL | Status: DC
  Administered 2017-03-24 – 2017-03-25 (×2): 1000 mL
  Filled 2017-03-23: qty 1000

## 2017-03-23 MED ORDER — METHYLENE BLUE 0.5 % INJ SOLN
INTRAVENOUS | Status: AC
  Filled 2017-03-23: qty 10

## 2017-03-23 MED ORDER — VITAL 1.5 CAL PO LIQD
1000.0000 mL | ORAL | Status: DC
  Filled 2017-03-23: qty 1000

## 2017-03-23 MED ORDER — POTASSIUM PHOSPHATES 15 MMOLE/5ML IV SOLN
20.0000 mmol | Freq: Once | INTRAVENOUS | Status: AC
  Administered 2017-03-23: 20 mmol via INTRAVENOUS
  Filled 2017-03-23: qty 6.67

## 2017-03-23 MED ORDER — CHLORHEXIDINE GLUCONATE 0.12 % MT SOLN
5.0000 mL | Freq: Four times a day (QID) | OROMUCOSAL | Status: DC
  Administered 2017-03-24 – 2017-03-27 (×6): 5 mL via OROMUCOSAL
  Filled 2017-03-23 (×7): qty 15

## 2017-03-23 MED ORDER — FREE WATER
240.0000 mL | Freq: Four times a day (QID) | Status: DC
  Administered 2017-03-23 – 2017-03-25 (×7): 240 mL

## 2017-03-23 MED ORDER — POLYETHYLENE GLYCOL 3350 17 G PO PACK
17.0000 g | PACK | Freq: Every day | ORAL | Status: DC
  Administered 2017-03-23 – 2017-03-24 (×2): 17 g
  Filled 2017-03-23 (×2): qty 1

## 2017-03-23 NOTE — Consult Note (Signed)
Adam Chambers, Adam Chambers 76 y.o., male [id number]     Chief Complaint: tracheal secretions  HPI: 76 yo bm with complex hx pharyngeal and esophageal ca.  Treated with RT, then cryotherapy x 2.  Underwent Laryngopharyngectomy with gastric pull up at [facility] on 22 JUN.  Has had hypothyroidism since his radiation replacement. Complex postoperative course including pneumonia. Last week, was noted by his wife to have secretions following jejunostomy feeding and early oral by mouth intake.  He became quite lethargic. He was admitted to the hospital with presumed aspiration pneumonia. TSH was 99. He is having trouble with dumping from his feedings. ENT was called to help evaluate may be a pharyngotracheal fistula.  Recent barium swallow showed a peculiar collection of barium posteriorly but was a technically inadequate study with no obvious fistulous tract identified.   Speech pathology has suspected a fistula from his clinical behavior. PMH: Past Medical History:  Diagnosis Date  . Allergic rhinitis   . Blood transfusion   . Blurred vision, left eye   . BPH (benign prostatic hypertrophy)   . CHF (congestive heart failure) ([place])   . Chronic back pain   . 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 Hepatitis 1995   food?  . History of chemotherapy 12/28/09 & then again 02/02/10 & 6/21/211   1 cycle cisplatin, then taxol & carboplatin  . Hx of radiation therapy 5/9/211-02/09/2010   rad tx tonsillar region   . Hypertension   . Hypothyroid 02/02/2012  . Hypothyroid 02/02/2012  . Otitis media    left eye  . Spinal stenosis    cervical and lumbar  . 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 Thyroid disease    secondary to radiation therapy  . 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 BACK SURGERY     1992  . CERVICAL FUSION    . ESOPHAGOGASTRODUODENOSCOPY   03/21/14  . ESOPHAGOGASTRODUODENOSCOPY N/A 05/05/2014   Procedure: ESOPHAGOGASTRODUODENOSCOPY (EGD);  Surgeon: Gatha Mayer, MD;  Location: [facility];  Service: Endoscopy;  Laterality: N/A;  . ESOPHAGOGASTRODUODENOSCOPY N/A 07/02/2014   Procedure: ESOPHAGOGASTRODUODENOSCOPY (EGD);  Surgeon: Gatha Mayer, MD;  Location: [facility];  Service: Endoscopy;  Laterality: N/A;  Clips, Nasobiliary Drain, Nasogastric Tube  . GASTROSTOMY TUBE PLACEMENT     for nutrition /hydration , s/p mucositis assoc with odynophagia&dysphasia  . REFRACTIVE SURGERY     left eye  . TONSILLECTOMY      FHx:   Family History  Problem Relation Age of Onset  . Lung cancer Sister   . Diabetes Brother    SocHx:  reports that he quit smoking about 23 years ago. His smoking use included Cigarettes. He has a 68.00 pack-year smoking history. He has never used smokeless tobacco. He reports that he does not drink alcohol or use drugs.  ALLERGIES:  Allergies  Allergen Reactions  . Simvastatin Nausea And Vomiting    Medications Prior to Admission  Medication Sig Dispense Refill  . amLODipine (NORVASC) 5 MG tablet Take 10 mg by mouth daily.     . benazepril (LOTENSIN) 20 MG tablet Take 20 mg by mouth daily.    . diazepam (VALIUM) 5 MG tablet Take 5 mg by mouth every 6 (six) hours as needed for anxiety.     Marland Kitchen doxazosin (CARDURA) 8 MG tablet Take 8  mg by mouth daily as needed (for blood pressure).     . flunisolide (NASAREL) 29 MCG/ACT (0.025%) nasal spray Place 2 sprays into the nose daily as needed. Dose is for each nostril.     Marland Kitchen levothyroxine (SYNTHROID, LEVOTHROID) 100 MCG tablet Take 100 mcg by mouth daily before breakfast.    . metoprolol (TOPROL-XL) 200 MG 24 hr tablet Take 100 mg by mouth daily.     Marland Kitchen oxyCODONE (ROXICODONE) 5 MG/5ML solution Take 5 mLs (5 mg total) by mouth every 4 (four) hours as needed for severe pain. 500 mL 0  . oxyCODONE-acetaminophen (PERCOCET/ROXICET) 5-325 MG per tablet Take 1-2  tablets by mouth every 6 (six) hours as needed for severe pain. 20 tablet 0  . traZODone (DESYREL) 50 MG tablet Take 50 mg by mouth at bedtime.    Marland Kitchen levothyroxine (SYNTHROID) 75 MCG tablet Take 1 tablet (75 mcg total) by mouth daily before breakfast. (Patient not taking: Reported on 03/17/2017) 90 tablet 3    Results for orders placed or performed during the hospital encounter of 03/17/17 (from the past 48 hour(s))  Glucose, capillary     Status: Abnormal   Collection Time: 03/21/17  4:48 PM  Result Value Ref Range   Glucose-Capillary 121 (H) 65 - 99 mg/dL   Comment 1 Notify RN    Comment 2 Document in Chart   Glucose, capillary     Status: Abnormal   Collection Time: 03/21/17  8:18 PM  Result Value Ref Range   Glucose-Capillary 146 (H) 65 - 99 mg/dL  Glucose, capillary     Status: Abnormal   Collection Time: 03/21/17  8:36 PM  Result Value Ref Range   Glucose-Capillary 134 (H) 65 - 99 mg/dL  Glucose, capillary     Status: Abnormal   Collection Time: 03/22/17 12:04 AM  Result Value Ref Range   Glucose-Capillary 102 (H) 65 - 99 mg/dL  Basic metabolic panel     Status: Abnormal   Collection Time: 03/22/17  3:33 AM  Result Value Ref Range   Sodium 131 (L) 135 - 145 mmol/L   Potassium 3.4 (L) 3.5 - 5.1 mmol/L   Chloride 91 (L) 101 - 111 mmol/L   CO2 33 (H) 22 - 32 mmol/L   Glucose, Bld 117 (H) 65 - 99 mg/dL   BUN 8 6 - 20 mg/dL   Creatinine, Ser 1.04 0.61 - 1.24 mg/dL   Calcium 8.0 (L) 8.9 - 10.3 mg/dL   GFR calc non Af Amer >60 >60 mL/min   GFR calc Af Amer >60 >60 mL/min    Comment: (NOTE) The eGFR has been calculated using the CKD EPI equation. This calculation has not been validated in all clinical situations. eGFR's persistently <60 mL/min signify possible Chronic Kidney Disease.    Anion gap 7 5 - 15  Glucose, capillary     Status: Abnormal   Collection Time: 03/22/17  3:44 AM  Result Value Ref Range   Glucose-Capillary 145 (H) 65 - 99 mg/dL  Glucose, capillary      Status: Abnormal   Collection Time: 03/22/17  8:11 AM  Result Value Ref Range   Glucose-Capillary 126 (H) 65 - 99 mg/dL   Comment 1 Notify RN    Comment 2 Document in Chart   Magnesium     Status: Abnormal   Collection Time: 03/22/17 10:23 AM  Result Value Ref Range   Magnesium 1.6 (L) 1.7 - 2.4 mg/dL  Phosphorus     Status: Abnormal  Collection Time: 03/22/17 10:23 AM  Result Value Ref Range   Phosphorus 1.8 (L) 2.5 - 4.6 mg/dL  Glucose, capillary     Status: None   Collection Time: 03/22/17 11:29 AM  Result Value Ref Range   Glucose-Capillary 72 65 - 99 mg/dL   Comment 1 Notify RN    Comment 2 Document in Chart   Glucose, capillary     Status: None   Collection Time: 03/22/17  4:47 PM  Result Value Ref Range   Glucose-Capillary 89 65 - 99 mg/dL  Glucose, capillary     Status: Abnormal   Collection Time: 03/22/17  8:18 PM  Result Value Ref Range   Glucose-Capillary 138 (H) 65 - 99 mg/dL   Comment 1 Notify RN   Glucose, capillary     Status: Abnormal   Collection Time: 03/23/17 12:16 AM  Result Value Ref Range   Glucose-Capillary 107 (H) 65 - 99 mg/dL  Renal function panel     Status: Abnormal   Collection Time: 03/23/17  2:11 AM  Result Value Ref Range   Sodium 130 (L) 135 - 145 mmol/L   Potassium 4.4 3.5 - 5.1 mmol/L    Comment: DELTA CHECK NOTED   Chloride 92 (L) 101 - 111 mmol/L   CO2 32 22 - 32 mmol/L   Glucose, Bld 102 (H) 65 - 99 mg/dL   BUN 15 6 - 20 mg/dL   Creatinine, Ser 0.86 0.61 - 1.24 mg/dL   Calcium 8.0 (L) 8.9 - 10.3 mg/dL   Phosphorus 2.3 (L) 2.5 - 4.6 mg/dL   Albumin 2.1 (L) 3.5 - 5.0 g/dL   GFR calc non Af Amer >60 >60 mL/min   GFR calc Af Amer >60 >60 mL/min    Comment: (NOTE) The eGFR has been calculated using the CKD EPI equation. This calculation has not been validated in all clinical situations. eGFR's persistently <60 mL/min signify possible Chronic Kidney Disease.    Anion gap 6 5 - 15  Magnesium     Status: None   Collection Time:  03/23/17  2:11 AM  Result Value Ref Range   Magnesium 2.1 1.7 - 2.4 mg/dL  Glucose, capillary     Status: None   Collection Time: 03/23/17  4:16 AM  Result Value Ref Range   Glucose-Capillary 90 65 - 99 mg/dL  Glucose, capillary     Status: None   Collection Time: 03/23/17  8:45 AM  Result Value Ref Range   Glucose-Capillary 82 65 - 99 mg/dL   Comment 1 Notify RN    Comment 2 Document in Chart   Glucose, capillary     Status: Abnormal   Collection Time: 03/23/17 11:24 AM  Result Value Ref Range   Glucose-Capillary 151 (H) 65 - 99 mg/dL   Comment 1 Notify RN    Comment 2 Document in Chart    Dg Esophagus  Result Date: 03/21/2017 CLINICAL DATA:  76 year old male with history of increasing oropharyngeal secretions. Patient is status post laryngectomy, pharyngectomy an esophagectomy with gastric pull-through. EXAM: ESOPHOGRAM/BARIUM SWALLOW TECHNIQUE: Single contrast examination was performed using water soluble contrast (10 mL of Isovue-300). FLUOROSCOPY TIME:  Fluoroscopy Time:  1 minutes and 12 seconds Radiation Exposure Index (if provided by the fluoroscopic device): 4 mGy COMPARISON:  None. FINDINGS: Study was very limited by lack of patient mobility. Patient was imaged in a supine position, with the head of the bed elevated to approximately 40 degrees, slightly LPO. Within the limitations of today's examination, there  was no definite communication between the esophagus/stomach (patient has had esophagectomy with gastric pull-through) and the overlying trachea. Throughout the examination, extensive purulent secretions were spontaneously ejected and suctioned from the patient's stoma, presumably from underlying left lower lobe pneumonia (demonstrated on recent chest x-ray 03/20/2017). IMPRESSION: 1. No definite tracheoesophageal fistula identified on today's limited examination. However, as the patient could not be positioned in a prone position (which would be ideal for determining potential  tracheoesophageal fistula), underlying tracheoesophageal fistula cannot be entirely excluded on the basis of this examination. Electronically Signed   By: Vinnie Langton M.D.   On: 03/21/2017 17:07     Blood pressure 128/81, pulse 70, temperature 98.5 F (36.9 C), temperature source Oral, resp. rate 18, height '6\' 4"'$  (1.93 m), weight 68.6 kg (151 lb 3.8 oz), SpO2 95 %.  PHYSICAL EXAM: Overall appearance: He is somewhat cachectic appearing. He is alert. Mental status seems appropriate. He has no voice consistent with laryngectomy. He is breathing comfortably through his stoma but also coughing up yellowish opaque secretions. Head: NCAT Ears: Scaly Nose: Dry Oral Cavity: Moist with remaining teeth in good repair. Oral Pharynx/Hypopharynx/Larynx:  Could not visualize directly. For flexible laryngoscope, the nasopharynx is clear. Oropharynx clear. A suture line with retained clear  sutures is noted in the hypopharynx. The stomach appears viable and patent. There is a diverticular pocket or possibly a false passage to the right lateral aspect just above the suture line.  The internal tissues appear possibly necrotic.  I see no signs of recurrent cancer.  Neuro: Grossly intact Neck:  Extensive surgical changes. There are what appear to be silk sutures around the anterior circumference of the stoma. I suctioned away a small piece of necrotic cartilage. There does appear to be some wound necrosis at the top 1 cm of the anterior trachea.  These findings were corroborated with the flexible laryngoscope.  With a grape juice trial, prompt and frank drainage from a small fistulous track around the 8:30 position in the upper trachea.  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 Hypothyroidism. Malnutrition.  Plan: Should be decent vascularized tissue in the field from  the gastric pull-up. He needs to have aggressive nutrition,  and complete replacement of his hypothyroidism. I would probably give this as much as 6-8 weeks to clear with the patient completely nothing by mouth. Beyond that, he may need some sort of additional flap reconstruction if he does not heal.  I will add Peridex rinses which may lower the bacterial count in his mouth.  We'll see him back in my office in 2 weeks if he is discharged.  I will call Dr. Loni Dolly, his otolaryngology surgeon at the [facility] in [place].   [name] 10/28/1015, 2:58 PM

## 2017-03-23 NOTE — Progress Notes (Signed)
Internal Medicine Attending  Date: 03/23/2017  Patient name: Adam Chambers. Medical record number: [id number] Date of birth: [birth year] Age: 76 y.o. Gender: male  I saw and evaluated the patient. I reviewed the resident's note by Dr. Berneice Gandy and I agree with the resident's findings and plans as documented in her progress note.  When seen on rounds this morning [name] was frustrated with the fact that his bed was too short. We will try to get him a longer bed. He was tolerating the tube feeds and has achieved the target rate. 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 The goal is to aggressively treat his malnutrition which we are doing via the tube feeds, aggressively manage his hypothyroidism which we are doing with oral Synthroid, and keeping him nothing by mouth for at least 6-8 weeks. For convenience sake we are going to switch the timing of the tube feeds to run at night so that he is free during the day to participate in physical therapy. Finally, we are waiting on skilled nursing facility placement for rehabilitative purposes prior to returning home. I anticipate the above will be arranged in the next 24-48 hours.

## 2017-03-23 NOTE — Progress Notes (Signed)
Pt complaining of pain after ENT procedure. Spoke with attending team as not time for scheduled oxycodone. Given one time dose of 5mg  oxycodone for pt.   Fritz Pickerel, RN

## 2017-03-23 NOTE — Care Management Important Message (Signed)
Important Message  Patient Details  Name: Adam Chambers. MRN: [id number] Date of Birth: Oct 15, 1940   Medicare Important Message Given:  Yes    Maurisha Mongeau Abena 03/23/2017, 10:35 AM

## 2017-03-23 NOTE — Progress Notes (Signed)
Brief Nutrition Note:   Tolerating Vital 1.5 at goal of 95 ml/hr over 15 hours with no signs of GI distress  Noted pt with pharyngeal-tracheal fistula with possible pharyngeal dehiscence with a diverticulum or false passage to the right of the stomach. Noted plan for pt to be NPO for at least 6-8 weeks.   Phosphorus and magesium repleated, potassium wdl  Discussed nutrition poc with MD Nedrud. MD wishing to switch current regimen to noctornal feedings to allow pt to get up and OOB for PT/OT without difficulty.   Plan to continue Vital 1.5 at rate of 95 ml/hr for 15 hours but will change infusion time to begin at 1800 every evening and infuse until 0900 the following day. This will begin 8/3. Continue current regimen for today. Times may be adjusted if needed in future   Pt no longer on IV fluids. Recommend addition of free water flushes of 240 mL QID providing total of 2043 mL of free water total.   Kerman Passey MS, RD, LDN [phone] Pager  [phone] Weekend/On-Call Pager

## 2017-03-23 NOTE — Progress Notes (Signed)
Changed water bottle and set up 

## 2017-03-23 NOTE — Progress Notes (Signed)
   Subjective:  Mr. Ryser was doing better today, however he is complaining that he is uncomfortable in bed because it is too short for him. He has tolerated his tube feeds well and denies any diarrhea.   Objective:  Vital signs in last 24 hours: Vitals:   03/23/17 0800 03/23/17 0928 03/23/17 1100 03/23/17 1132  BP: 128/81 128/81  128/81  Pulse: 68 64  70  Resp: (!) 22 13  18   Temp:      TempSrc:      SpO2: 98% 95% 97% 95%  Weight:      Height:       Physical Exam  Constitutional:  Chronically ill appearing and thin man sitting in bed in no acute distress.  HENT:  Stoma site non erythematous and without secretions. Trach collar in place for oxygenation.  Cardiovascular: Normal rate, regular rhythm, normal heart sounds and intact distal pulses.   No murmur heard. Pulmonary/Chest: Effort normal and breath sounds normal. No respiratory distress. He has no wheezes.  Musculoskeletal: He exhibits no edema (of bilateral lower extremities) or tenderness (of bilateral lower extremities).  Skin: Skin is warm and dry. No erythema. No pallor.  Good skin turgor   Assessment/Plan:  Adam Chambers is a 76 yo with a PMH of HTN, CHF, tonsillar cancer, and esophageal cancer who was hospitalized with AMS in the setting of decreased PO intake after salvage esophagectomy and laryngopharygectomy about a month prior to presentation. Prior to admission the patient's primary caregiver was his wife. Leading up to admission she reported decreased PO intake and diarrhea with tube feeds. The patient was reportedly unresponsive prior to presentation and improved with rehdyration during hospitalization, suggesting nutrition will be an important problem to address during his recovery from surgery. The specific problems addressed during this admission are as follows.   Increased Secretions from Stoma Site: Concern for possible tracheoesophageal fistula following recent laryngopharyngectomy. Consulted ENT today to  assess that the patient does not have increased risk for aspiration and for evaluation of abnormal pooling observed by speech pathology on swallow study. Patient and wife requesting placement at rehabilitation facility after this hospitalization so that he can get strength back. This goal is reasonable and appreciate assistance from case management regarding this goal.  -Continue clearance of secretions PRN -Continue PT/OT  -Discontinue telemetry, move to floor as patient is progressing towards goal of discharge   S/P Salvage Esophagectomy and Laryngopharygectomy on 6/22 with dumping syndrome, electrolyte disturbances, and protein-calorie malnutrition Patient tolerating tube feeds without any diarrhea.  Nutrition increased rate of Vital 1.5L with rate @ 95 ml/hr for a total of 15 hours on 7/31. Would like to increase rate and switch to overnight tube feeds, as patient desires to be up and out of bed for PT/OT at rehabilitation facility upon discharge from this hospital. Recommendations toward this goal are appreciated.  -Continue electrolyte replacement with KPhos 20 mmols BID -BMP, Mg, Phos qAM -Nutrition consulted regarding tube feeds, appreciate recommendations. -Follow up with nutrition as stated above   Hypothyroidism: The patient's TSH=86.9 and free Tr=0.49, suggesting hypothyroidism -Continue levothyroxine 100mg  via tube -Recheck thyroid function outpatient   HTN: well controlled on home regimen -Continue amlodipine 10mg  qd and metoprolol 50mg  bid  Dispo: Anticipated discharge in approximately 2-3 day(s).   Thomasene Ripple, MD 03/23/2017, 2:56 PM Pager: [phone]

## 2017-03-24 DIAGNOSIS — F322 Major depressive disorder, single episode, severe without psychotic features: Secondary | ICD-10-CM

## 2017-03-24 DIAGNOSIS — J9504 Tracheo-esophageal fistula following tracheostomy: Secondary | ICD-10-CM

## 2017-03-24 DIAGNOSIS — F4323 Adjustment disorder with mixed anxiety and depressed mood: Secondary | ICD-10-CM

## 2017-03-24 LAB — GLUCOSE, CAPILLARY
GLUCOSE-CAPILLARY: 214 mg/dL — AB (ref 65–99)
GLUCOSE-CAPILLARY: 74 mg/dL (ref 65–99)
GLUCOSE-CAPILLARY: 89 mg/dL (ref 65–99)
GLUCOSE-CAPILLARY: 89 mg/dL (ref 65–99)
Glucose-Capillary: 81 mg/dL (ref 65–99)
Glucose-Capillary: 86 mg/dL (ref 65–99)
Glucose-Capillary: 89 mg/dL (ref 65–99)
Glucose-Capillary: 179 mg/dL — ABNORMAL HIGH (ref 65–99)

## 2017-03-24 LAB — CREATININE, SERUM
Creatinine, Ser: 1.46 mg/dL — ABNORMAL HIGH (ref 0.61–1.24)
GFR calc Af Amer: 52 mL/min — ABNORMAL LOW (ref >60)
GFR calc non Af Amer: 45 mL/min — ABNORMAL LOW (ref >60)

## 2017-03-24 LAB — BASIC METABOLIC PANEL
ANION GAP: 9 (ref 5–15)
ANION GAP: 7 (ref 5–15)
BUN: 13 mg/dL (ref 6–20)
BUN: 11 mg/dL (ref 6–20)
CALCIUM: 8.5 mg/dL — AB (ref 8.9–10.3)
CHLORIDE: 87 mmol/L — AB (ref 101–111)
CO2: 33 mmol/L — AB (ref 22–32)
CO2: 33 mmol/L — AB (ref 22–32)
CREATININE: 0.87 mg/dL (ref 0.61–1.24)
Calcium: 8.3 mg/dL — ABNORMAL LOW (ref 8.9–10.3)
Chloride: 89 mmol/L — ABNORMAL LOW (ref 101–111)
Creatinine, Ser: 0.82 mg/dL (ref 0.61–1.24)
GFR calc Af Amer: >60 mL/min (ref >60)
GFR calc non Af Amer: >60 mL/min (ref >60)
GFR calc non Af Amer: >60 mL/min (ref >60)
GLUCOSE: 128 mg/dL — AB (ref 65–99)
Glucose, Bld: 91 mg/dL (ref 65–99)
POTASSIUM: 4.9 mmol/L (ref 3.5–5.1)
Potassium: 4.6 mmol/L (ref 3.5–5.1)
SODIUM: 129 mmol/L — AB (ref 135–145)
Sodium: 129 mmol/L — ABNORMAL LOW (ref 135–145)

## 2017-03-24 LAB — MAGNESIUM: Magnesium: 2.4 mg/dL (ref 1.7–2.4)

## 2017-03-24 LAB — PREALBUMIN: Prealbumin: 7.4 mg/dL — ABNORMAL LOW (ref 18–38)

## 2017-03-24 LAB — PHOSPHORUS: PHOSPHORUS: 4.8 mg/dL — AB (ref 2.5–4.6)

## 2017-03-24 MED ORDER — VENLAFAXINE HCL 37.5 MG PO TABS: 37.5000 mg | ORAL_TABLET | Freq: Two times a day (BID) | ORAL | 3 refills | Status: DC

## 2017-03-24 MED ORDER — FREE WATER: 240.0000 mL | Freq: Four times a day (QID) | 30 refills | Status: DC

## 2017-03-24 MED ORDER — VITAL 1.5 CAL PO LIQD: 1000.0000 mL | ORAL | 30 refills | Status: DC

## 2017-03-24 MED ORDER — OXYCODONE HCL 5 MG/5ML PO SOLN
10.0000 mg | ORAL | Status: DC | PRN
  Administered 2017-03-24 – 2017-03-27 (×14): 10 mg
  Filled 2017-03-24 (×14): qty 10

## 2017-03-24 MED ORDER — OXYCODONE HCL 5 MG/5ML PO SOLN: 10.0000 mg | ORAL | 0 refills | Status: AC | PRN

## 2017-03-24 MED ORDER — VITAL 1.5 CAL PO LIQD: 1000.0000 mL | ORAL | 30 refills | Status: AC

## 2017-03-24 MED ORDER — FREE WATER: 240.0000 mL | Freq: Four times a day (QID) | 30 refills | Status: AC

## 2017-03-24 MED ORDER — VENLAFAXINE HCL 37.5 MG PO TABS
37.5000 mg | ORAL_TABLET | Freq: Two times a day (BID) | ORAL | Status: DC
  Administered 2017-03-24 – 2017-03-27 (×6): 37.5 mg
  Filled 2017-03-24 (×9): qty 1

## 2017-03-24 MED ORDER — VENLAFAXINE HCL 37.5 MG PO TABS: 37.5000 mg | ORAL_TABLET | Freq: Two times a day (BID) | ORAL | 3 refills | Status: AC

## 2017-03-24 NOTE — NC FL2 (Signed)
[place] LEVEL OF CARE SCREENING TOOL     IDENTIFICATION  Patient Name: Adam Chambers. Birthdate: [birth year] Sex: male Admission Date (Current Location): 03/17/2017  [facility] and [place] Number:  Herbalist and Address:  The . [facility] [place] [address], [place], [place] [address]      Provider Number: [id number]  Attending Physician Name and Address:  Oval Linsey, MD  Relative Name and Phone Number:       Current Level of Care: Hospital Recommended Level of Care: Other (Comment) Prior Approval Number:    Date Approved/Denied:   PASRR Number:    Discharge Plan: Other (Comment) (home with home health)    Current Diagnoses: Patient Active Problem List   Diagnosis Date Noted  . Persistent postoperative fistula   . Benign prostatic hyperplasia 03/18/2017  . Hemorrhoids 03/18/2017  . Hypertension 03/18/2017  . Spinal stenosis 03/18/2017  . Hyponatremia 03/18/2017  . Altered mental status 03/17/2017  . Protein malnutrition ([place]) 08/26/2016  . Status post tracheostomy ([place]) 08/26/2016  . Esophageal cancer, stage IIA ([place]) 04/16/2014  . Xerostomia 01/30/2014  . Dysphagia 01/30/2014  . Elevated serum creatinine 01/30/2014  . Hypothyroidism 02/02/2012  . Chronic back pain 07/01/2011  . Tonsil cancer ([place])     Orientation RESPIRATION BLADDER Height & Weight     Self, Time, Situation, Place  Tracheostomy (laryngectomy. stoma. no inner cannula. ) Continent Weight: 159 lb 13.3 oz (72.5 kg) Height:  6\' 4"  (193 cm)  BEHAVIORAL SYMPTOMS/MOOD NEUROLOGICAL BOWEL NUTRITION STATUS      Continent Feeding tube (feeding tube at night, NPO during the day)  AMBULATORY STATUS COMMUNICATION OF NEEDS Skin   Limited Assist Verbally (voice amplifier ) Normal                       Personal Care Assistance Level of Assistance  Bathing, Feeding, Dressing Bathing Assistance: Limited assistance Feeding assistance: Independent (tube feed  at night, NPO during the day) Dressing Assistance: Limited assistance     Functional Limitations Info  Sight, Hearing, Speech Sight Info: Adequate Hearing Info: Adequate Speech Info: Impaired    SPECIAL CARE FACTORS FREQUENCY  PT (By licensed PT), OT (By licensed OT), Speech therapy     PT Frequency: 3x wk OT Frequency: 3x wk     Speech Therapy Frequency: 3x wk      Contractures Contractures Info: Not present    Additional Factors Info  Code Status, Allergies Code Status Info: Full Code Allergies Info: Simvastatin           Current Medications (03/24/2017):  This is the current hospital active medication list Current Facility-Administered Medications  Medication Dose Route Frequency Provider Last Rate Last Dose  . amLODipine (NORVASC) tablet 10 mg  10 mg Oral Daily Maryellen Pile, MD   10 mg at 03/24/17 1032  . chlorhexidine (PERIDEX) 0.12 % solution 5 mL  5 mL Mouth/Throat QID [name], MD   5 mL at 03/24/17 1313  . diazepam (VALIUM) tablet 5 mg  5 mg Oral Q6H PRN Maryellen Pile, MD   5 mg at 03/21/17 0310  . enoxaparin (LOVENOX) injection 40 mg  40 mg Subcutaneous Q24H Maryellen Pile, MD   40 mg at 03/24/17 1314  . feeding supplement (VITAL 1.5 CAL) liquid 1,000 mL  1,000 mL Per Tube Q24H Nedrud, Marybeth, MD      . free water 240 mL  240 mL Per Tube QID Nedrud, Marybeth,  MD   240 mL at 03/24/17 1600  . iopamidol (ISOVUE-300) 61 % injection 150 mL  150 mL Oral Once PRN Chundi, Vahini, MD      . levothyroxine (SYNTHROID, LEVOTHROID) tablet 100 mcg  100 mcg Oral QAC breakfast Maryellen Pile, MD   100 mcg at 03/24/17 1032  . Melatonin TABS 18 mg  18 mg Oral QHS Jaquita Folds, RPH   18 mg at 03/23/17 2104  . metoprolol tartrate (LOPRESSOR) tablet 50 mg  50 mg Oral BID Joni Reining C, DO   50 mg at 03/24/17 1032  . oxyCODONE (ROXICODONE) 5 MG/5ML solution 10 mg  10 mg Per Tube Q4H PRN Nedrud, Larena Glassman, MD   10 mg at 03/24/17 1457  . polyethylene glycol (MIRALAX /  GLYCOLAX) packet 17 g  17 g Per Tube Daily Colbert Ewing, MD   17 g at 03/23/17 2103  . traZODone (DESYREL) tablet 50 mg  50 mg Oral QHS Maryellen Pile, MD   50 mg at 03/23/17 2103  . venlafaxine (EFFEXOR) tablet 37.5 mg  37.5 mg Per Tube BID WC Nedrud, Larena Glassman, MD         Discharge Medications: Please see discharge summary for a list of discharge medications.  Relevant Imaging Results:  Relevant Lab Results:   Additional Information [phone]  Wende Neighbors, LCSW

## 2017-03-24 NOTE — Progress Notes (Signed)
   03/23/17 1640  SLP Visit Information  SLP Received On 03/23/17  General Information  Behavior/Cognition Alert;Cooperative  HPI Adam Chambers is a 76yo man with PMH of tonsilar cancer (s/p chemoXRT 2011), esophageal cancer s/p laryngopharyngectomy (salvage), who presented for AMS and generalized weakness. Diagnosed in November 2015 with esophageal cancer. He underwent multiple series of cryotherapy. However, he had recurrence and underwent cryotherapy again in 2017. On PET-CT on 09/06/16 he was found to have a large esophageal mass and avid mediastinal lymph nodes concerning for recurrence and metastasis. He therefore underwent salvage esophagectomy and laryngopharygectomy at [facility] [facility] on 6/22. After this surgery, he developed a pneumonia and was admitted to the hospital for about another week. They have only been home for 1 week here in [place].He has only intermittently been taking tube feeding and has started eating a little bit by mouth. He has had intermittent diarrhea that seems to be associated with food intake. Referred for swallowing evaluation.  Temperature Spikes Noted No  Respiratory Status Room air  Oral Cavity - Dentition Adequate natural dentition  Patient observed directly with PO's Yes  Type of PO's observed Thin liquids  Liquids provided via Straw  Treatment Provided  Treatment provided Dysphagia  Dysphagia Treatment  Treatment Methods Compensation strategy training;Patient/caregiver education  Family/Caregiver Educated wife  Pharyngeal Phase Signs & Symptoms Immediate cough  Type of cueing Verbal  Amount of cueing Minimal  SLP able to attend ENT assessment for understanding of pts tracheal-pharyngeal fistula. Discussed course with ENT. Reinforced need for strict thorough oral care measures to reduce oral bacterial load given that pt will be NPO but with ongoing aspiration of oral secretions. No further SLP interventions needed given need for strict NPO  and healing over 6-8 weeks. Thank you for this interesting consult. I have enjoyed working with Mr. Caselli and his wife.                      SLP - End of Session  Patient left in bed;with family/visitor present;with nursing in room  Assessment / Recommendations / Plan  Plan All goals met  Dysphagia Recommendations  Diet recommendations NPO  General Recommendations  Oral Care Recommendations Oral care QID;Patient independent with oral care;Staff/trained caregiver to provide oral care  Progression Toward Goals  Progression toward goals Goals met, education completed, patient discharged from SLP  SLP Time Calculation  SLP Start Time (ACUTE ONLY) 1415  SLP Stop Time (ACUTE ONLY) 1450  SLP Time Calculation (min) (ACUTE ONLY) 35 min

## 2017-03-24 NOTE — Progress Notes (Signed)
Internal Medicine Attending  Date: 03/24/2017  Patient name: Adam Chambers. Medical record number: [id number] Date of birth: [birth year] Age: 76 y.o. Gender: male  I saw and evaluated the patient. I reviewed the resident's note by Dr. Berneice Gandy and I agree with the resident's findings and plans as documented in her progress note.  When seen on rounds this morning Mr. Diiorio was notably frustrated. He had multiple complaints including the shortness of the bed which had not yet been addressed as the extension was pending, and continued pain. It also appears he is continuing to refuse participation with physical therapy. We had a frank discussion about depression today and he is open to therapy. I am concerned that if we are unable to address his frustration and sense of total lack of control he will continue to avoid participating in physical therapy and ultimately developed a pneumonia further complicating his case. When we asked the patient where he would like to continue with therapy he insisted upon home. Dr. Berneice Gandy spent considerable time trying to get this arranged today, but at the last minute the family changed their mind and wanted to get home health services through the [place]. Unfortunately, this can not be arranged until next week. We will therefore continue current supportive care [facility].

## 2017-03-24 NOTE — Progress Notes (Signed)
Subjective:  Adam Chambers was distressed during exam and requesting more pain medication, as he has been in much pain since his exam by ENT yesterday. He has a flat affect but expresses frustration and agitation through out interview.   Objective:  Vital signs in last 24 hours: Vitals:   03/24/17 0420 03/24/17 0752 03/24/17 0834 03/24/17 1155  BP: 111/79  130/85   Pulse: 67 70 72 74  Resp: (!) 23 (!) 9 14 15   Temp: 98.3 F (36.8 C)  98.6 F (37 C)   TempSrc: Oral  Oral   SpO2: 98% 97% 98% 97%  Weight: 159 lb 13.3 oz (72.5 kg)     Height:       Physical Exam  Constitutional:  Chronically ill appearing and thin man sitting in bed in no acute distress.  HENT:  Stoma site non erythematous and with secretions which were easily suctioned. Trach collar in place for oxygenation.  Cardiovascular: Normal rate, regular rhythm, normal heart sounds and intact distal pulses.   No murmur heard. Pulmonary/Chest: Effort normal and breath sounds normal. No respiratory distress.  Abdominal: Soft. He exhibits no distension. There is no tenderness.  Musculoskeletal: He exhibits no edema (of bilateral lower extremities) or tenderness (of bilateral lower extremities).  Skin: Skin is warm and dry. No erythema. No pallor.  Good skin turgor  Psychiatric:  Patient expresses anger and frustration about pain and how uncomfortable he is in the hospital during the exam. He has a flat affect throughout exam. No SI/HI   Assessment/Plan:  [name] is a 76 yo with a PMH of HTN, CHF, tonsillar cancer, and esophageal cancer who was hospitalized with AMS in the setting of decreased PO intake after salvage esophagectomy and laryngopharygectomy about a month prior to presentation. Prior to admission the patient's primary caregiver was his wife. Leading up to admission she reported decreased PO intake and diarrhea with tube feeds.The specific problems addressed during this admission are as follows.    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 Per recommendations from ENT the patient will remain strictly NPO, continue aggressive nutrition and complete replacement of hypothyroidism to help with healing. The patient will follow up with ENT for management of this fistula tract. The patient has complaints of increased pain after exam and was given oxycodone 10 mg for pain management. - Increase oxycodone 5 mg to oxycodone 10 mg q4 hours for pain via tube -Continue clearance of secretions PRN -Follow up with ENT as an outpatient in 2 weeks  S/P Salvage Esophagectomy and Laryngopharygectomy on 6/22 with dumping syndrome, electrolyte disturbances, and protein-calorie malnutrition: Patient tolerating tube feeds without any diarrhea and meeting all goals.  -BMP, Mg, Phos this AM showed electrolytes within normal limts -Plan to discharge patient with current tube feeding regimen, which will include 1 L of Vital  1.5 at a rate of 95 ml/hr for 15 hours starting daily at 1800 and infusing until 0900 the following day. This regimen will be started today, 8/3.  Situational Depression: The patient has a flat affect during exam and has minimal participation with interviewer throughout. When probed he states that his mood has been poor and that he has high anxiety about being in pain. The patient is agreeble to starting an antidepressant at this time. We will start Effexor, given that it may also help with chronic pain  in addition to mood symptoms. Patient will need to follow up with PCP regarding formal depression diagnosis and continuing treatment.  - Start venlafaxine 37.5 mg BID via tube  Hypothyroidism: The patient's TSH=86.9 and free Tr=0.49, suggesting hypothyroidism -Continue levothyroxine 100mg  via tube -Recheck thyroid  function outpatient in 6 weeks  HTN: well controlled on home regimen -Continue amlodipine 10mg  qd and metoprolol 50mg  bid  Dispo: Anticipated discharge in approximately 1 day.   Thomasene Ripple, MD 03/24/2017, 1:46 PM Pager: [phone]

## 2017-03-24 NOTE — Progress Notes (Signed)
PT Cancellation Note  Patient Details Name: Adam Chambers. MRN: [id number] DOB: 1941/06/27   Cancelled Treatment:    Reason Eval/Treat Not Completed: Patient declined, no reason specified Patient declined mobility.  Wife present. PT will continue to follow acutely.    Salina April, PTA Pager: [phone]   03/24/2017, 3:17 PM

## 2017-03-24 NOTE — Discharge Summary (Signed)
Name: Adam Chambers. MRN: [id number] DOB: [birth year] 76 y.o. PCP: Windy Fast, MD  Date of Admission: 03/17/2017  5:20 PM Date of Discharge: 03/27/2017 Attending Physician: Oval Linsey, MD  Discharge Diagnosis:   [name] is a 76 yo with a PMH of HTN, CHF, tonsillar cancer, and esophageal cancer who was hospitalized with AMS in the setting of decreased PO intake after salvage esophagectomy and laryngopharygectomy about a month prior to presentation.  Principal Problem:   Altered mental status Active Problems:   Tonsil cancer ([place])   Hypothyroidism   Hypertension   Protein malnutrition ([place])   Status post tracheostomy ([place])   Hyponatremia   Persistent postoperative fistula   Current severe episode of major depressive disorder without psychotic features without prior episode ([place])   Aspiration pneumonia of right lower lobe (HCC)   Dynamic ileus ([place])   Wound dehiscence  Discharge Medications: Allergies as of 03/27/2017      Reactions   Simvastatin Nausea And Vomiting      Medication List    STOP taking these medications   oxyCODONE-acetaminophen 5-325 MG tablet Commonly known as:  PERCOCET/ROXICET     TAKE these medications   amLODipine 5 MG tablet Commonly known as:  NORVASC Take 10 mg by mouth daily.   benazepril 20 MG tablet Commonly known as:  LOTENSIN Take 20 mg by mouth daily.   diazepam 5 MG tablet Commonly known as:  VALIUM Take 5 mg by mouth every 6 (six) hours as needed for anxiety.   doxazosin 8 MG tablet Commonly known as:  CARDURA Take 8 mg by mouth daily as needed (for blood pressure).   feeding supplement (VITAL 1.5 CAL) Liqd Place 1,000 mLs into feeding tube daily.   flunisolide 29 MCG/ACT nasal spray Commonly known as:  NASAREL Place 2 sprays into the nose daily as needed. Dose is for each nostril.   free water Soln Place 240 mLs into feeding tube 4 (four) times daily.   levothyroxine 100 MCG tablet Commonly known as:   SYNTHROID, LEVOTHROID Take 100 mcg by mouth daily before breakfast. What changed:  Another medication with the same name was removed. Continue taking this medication, and follow the directions you see here.   metoprolol 200 MG 24 hr tablet Commonly known as:  TOPROL-XL Take 100 mg by mouth daily.   oxyCODONE 5 MG/5ML solution Commonly known as:  ROXICODONE Take 5 mLs (5 mg total) by mouth every 4 (four) hours as needed for severe pain. What changed:  Another medication with the same name was added. Make sure you understand how and when to take each.   oxyCODONE 5 MG/5ML solution Commonly known as:  ROXICODONE Place 10 mLs (10 mg total) into feeding tube every 4 (four) hours as needed for severe pain. What changed:  You were already taking a medication with the same name, and this prescription was added. Make sure you understand how and when to take each.   traZODone 50 MG tablet Commonly known as:  DESYREL Take 50 mg by mouth at bedtime.   venlafaxine 37.5 MG tablet Commonly known as:  EFFEXOR Place 1 tablet (37.5 mg total) into feeding tube 2 (two) times daily with a meal.            Durable Medical Equipment        Start     Ordered   03/24/17 1512  DME 3-in-1  Once     03/24/17 1551  Disposition and follow-up:   Mr.Adam Chambers. was discharged from [facility] in stable condition.  At the hospital follow up visit please address:  1.  Hospital problems: 1) Patient has pharyngeal-tracheal fistula that was identified during hospitalization, with plans to follow up with ENT in 2 weeks. 2) Patient started new feeding regimen while inpatient to help with recovery from surgery and subsequent complications. 3) The patient was found to be severely hypothyroid on admission and has been given 100 mcg levothyroxine during hospitalization.   2.  Labs / imaging needed at time of follow-up: BMP, Mg, Phos levels to ensure that patient's electrolyte status and  kidney function is normal. The patient's electrolyte were within normal limits on day of discharge, except for his sodium. His pre-albumin was 7.3. Patient will need TSH after discharge to ensure complete hypothyroidism replacement.   3.  Pending labs/ test needing follow-up: None  Follow-up Appointments: Follow-up Information    [facility] Follow up.   Why:  [facility] services being set up throught pt's PCP at the [place] - HHRN/PT/OT/SLP/aide- orders faxed to the [facility] The information: [place] [address] [phone]        Windy Fast, MD. Schedule an appointment as soon as possible for a visit.   Specialty:  Internal Medicine Contact information: [place] [address]        [name], MD. Schedule an appointment as soon as possible for a visit in 2 week(s).   Specialty:  Otolaryngology Contact information: 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 The patient had a chest Xray on admission which was not suggestive of new aspiration pneumonia and was thought to be consistent with resolving pneumonia seen on previous studies. On 8/4 the patient started to complain of worsening, increased stomal secretions that became foul smelling. A new chest Xray obtained on 8/4 showed new consolidation at right lung base concerning for aspiration pneumonia and stable right upper lobe opacity also suspicious for aspiration pneumonia given new clinical history. A pharyngeal tracheal fistula was identified by ENT during hospitalization on 8/2 and a re-interpretation of a swallow study perfomred on 03/21/17 showed a possible anastomotic leak on 8/3. Given concern for  aspiration pneumonia and possible leakage of gastric contents into airway, the patient was started on Unasyn on 8/4 and switched to meropenem on 8/5.   Cardiothoracic surgery did not recommend surgery at this time. Follow up consult on 8/6 by Dr. Erik Obey with ENT recommended NPO for 6-8 weeks, aggressive nutrition, and complete hypothyroidism replacement. Given the patient's surgical complication, his family requested transfer to [facility] of [place] for evaluation and possible surgical repair.   Dumping syndrome with electrolyte disturbances and protein-calorie malnutrition: The patient was started on a new regimen which includes 1L of Vital 1.5cal at a rate of 95 mL/Hr for 15 hours every day to meet aggressive nutritional goals needed for healing. He takes feed starting at 1800 while inpatient and stopped at 0900. His prealbumin on 8/3 was 7.4. The patient developed in Ile[place] and worsening abdominal pain on 8/4. This was likely secondary to electrolyte abnormalities and/or decreased mobility throughout hospitalization as the patient did not participate in PT/OT  throughout stay. His feeds were started overnight on 8/5 at half goal rate and were well tolerated. He can be advanced to goal on 8/6.   Hypothyroidism: The patient's TSH=86.9 and free Tr=0.49, suggesting hypothyroidism. The patient was observed to by hyponatremic with urine osmlality= 390, urine sodium= 73, and blood osmolality = 268, consistent with severe hypothyroidism and possible adrenal insufficiency. Given this, a new TSH was obtained on 8/6 to determine if hypothyroidism is making some improvement with therapy. For now, will continue levothyroxine 100mg . Re-check TSH was 59.  Possible urinary infection: Patient was given methylene blue to discern whether his stomal secretions represent enteric flora. The nurse noticed brisk output of blue dye with first administration of methylene blue concerning for entero-vesicular fistula. A UA was  obtained, which contained many bacteria, nitrites, without WBCs and increased squamous cells. Another clean catch urinalysis was obtained on 8/6, as this first UA likely represents a dirty catch with possible contamination.  HTN: Well controlled on home regimen of amlodipine 10mg  daily and metoprolol 50mg  BID. The patient's BP was normotensive throughout stay, but started to trend toward hypotension towards the end of his stay. Can consider removal of agents and/or workup for adrenal insufficiency if patient continues to be hypotensive.  Situational depression: Patient had depressed mood symptoms and decreased motivation during hospitalization. He refused to participate with PT/OT during admission. The patient was started on venlafaxine 37.5 BID while inpatient to assist with depressed mood and chronic pain. Please follow up.  Discharge Vitals:   BP 118/72 (BP Location: Left Arm)   Pulse 65   Temp 99.1 F (37.3 C) (Oral)   Resp 18   Ht 6\' 4"  (1.93 m)   Wt 159 lb 13.3 oz (72.5 kg)   SpO2 95%   BMI 19.46 kg/m   Pertinent Labs, Studies, and Procedures:   BMP Latest Ref Rng & Units 03/27/2017 03/26/2017 03/24/2017  Glucose 65 - 99 mg/dL 132(H) 98 128(H)  BUN 6 - 20 mg/dL 18 12 11   Creatinine 0.61 - 1.24 mg/dL 1.17 0.80 0.82  Sodium 135 - 145 mmol/L 125(L) 126(L) 129(L)  Potassium 3.5 - 5.1 mmol/L 4.3 4.3 4.6  Chloride 101 - 111 mmol/L 84(L) 84(L) 89(L)  CO2 22 - 32 mmol/L 31 36(H) 33(H)  Calcium 8.9 - 10.3 mg/dL 8.4(L) 8.4(L) 8.5(L)   CBC Latest Ref Rng & Units 03/18/2017 03/17/2017 01/30/2014  WBC 4.0 - 10.5 K/uL 6.5 6.6 4.0  Hemoglobin 13.0 - 17.0 g/dL 9.9(L) 9.8(L) 14.9  Hematocrit 39.0 - 52.0 % 30.1(L) 29.9(L) 45.0  Platelets 150 - 400 K/uL 393 431(H) 226   Prealbumin on 8/3 = 7.4 Mg on 8/6 = 2.0 Phos on 8/6 = 3.2  Urine osmolality on 8/6 = 390 Urine sodium = 73 Blood osmolality = 268  Xray and Fluoroscopy of Esophagus: FINDINGS: Study was very limited by lack of patient  mobility. Patient was imaged in a supine position, with the head of the bed elevated to approximately 40 degrees, slightly LPO. Within the limitations of today's examination, there was no definite communication between the esophagus/stomach (patient has had esophagectomy with gastric pull-through) and the overlying trachea. Throughout the examination, extensive purulent secretions were spontaneously ejected and suctioned from the patient's stoma, presumably from underlying left lower lobe pneumonia (demonstrated on recent chest x-ray 03/20/2017).  IMPRESSION: 1. No definite tracheoesophageal fistula identified on today's limited examination. However, as the patient could not be positioned in a prone position (which would be ideal for determining potential tracheoesophageal fistula),  underlying tracheoesophageal fistula cannot be entirely excluded on the basis of this examination.  ADDENDUM: Upon further review of the images and after telephone discussion with Dr. Erik Obey, there is a focal outpouching of contrast best appreciated on series 5 images 5-17, immediately anterior to the mid to distal aspect of the patient's cervical spinal fusion hardware. This persists after transit of the contrast bolus, and is clearly extraluminal outside the confines of the cervical esophagus. This corresponds to the reported false passage in this region on recent flexible laryngoscopy, concerning for dehiscence at or near the of the anastomosis for the gastric pull-through.  Electronically Signed   By: Vinnie Langton M.D.   On: 03/24/2017 12:49  Chest Xray on 7/30 COMPARISON:  Chest radiograph dated 03/18/2017 and chest CT dated 02/18/2010  FINDINGS: There is an area of increased density at the left lung base/ retrocardiac region. Patchy area of hazy density in the right upper lobe again noted. There is slight blunting of the right costophrenic angle which may represent a small pleural  effusion versus scarring. Trace left pleural effusion may be present. No pneumothorax. There is stable top-normal cardiac size. Lower cervical fixation hardware. Degenerative changes of the shoulders and spine. No acute osseous pathology.  IMPRESSION: 1. Patchy airspace density in the right upper lobe as seen on the prior radiograph may represent developing infiltrate versus scarring. 2. Left lung base, retrocardiac density may represent atelectasis or infiltrate. 3. Possible trace bilateral pleural effusions.  Electronically Signed   By: Anner Crete M.D.   On: 03/20/2017 21:02  Chest Xray on 8/4 FINDINGS: New small ill-defined opacity at the right lung brace and a lateral aspect, suspicious for aspiration pneumonitis. Additional opacity within the right upper lobe, lateral aspect, is stable. Small right pleural effusion.  Left lung is clear.  Heart size and mediastinal contours are stable.  IMPRESSION: 1. New ill-defined airspace opacity at the right lung base, lateral aspect, suspicious for aspiration pneumonitis given the provided clinical data. 2. Similar stable opacity within the right upper lobe, also suspicious for aspiration versus pneumonia. 3. Small right pleural effusion. 4. Left lung remains clear. 5. Aortic atherosclerosis.  Electronically Signed   By: Franki Cabot M.D.   On: 03/25/2017 16:11  Discharge Instructions: Discharge Instructions    Call MD for:  difficulty breathing, headache or visual disturbances    Complete by:  As directed    Call MD for:  extreme fatigue    Complete by:  As directed    Call MD for:  persistant dizziness or light-headedness    Complete by:  As directed    Call MD for:  persistant nausea and vomiting    Complete by:  As directed    Call MD for:  severe uncontrolled pain    Complete by:  As directed    Diet general    Complete by:  As directed    Discharge instructions    Complete by:  As directed    Please  follow up with your PCP regarding changes to your medications made here in the hospital, including the addition of Effexor for your mood and pain symptoms. You were given a 5 day prescription for increased amount of oxycodone to use when leaving the hospital. Please resume your original prescription after this and talk to your PCP about your pain regimen when you follow up. Please allow home health services, including PT, OT, and nursing to help with your recovery.   Increase activity slowly  Complete by:  As directed       Signed: Maryellen Pile, MD 03/27/2017, 5:00 PM   Pager: [phone]

## 2017-03-24 NOTE — Care Management Note (Addendum)
Case Management Note Adam Gibbons RN, BSN Unit 4E-Case Manager [phone]  Patient Details  Name: Adam Chambers. MRN: [id number] Date of Birth: [birth year]  Subjective/Objective:     Pt admitted with PNA/sepsis hx of chronic trach              Action/Plan: PTA pt lived at home with wife- referral for [facility] needs- spoke with pt and wife at bedside- per wife pt is on home TF- get their supplies from [place] along with medications- pt has PCP- DR. Woods at the [facility], has had procedures at [facility] : [facility]. Pt has had [facility] services in past with [facility]- per wife not active currently with any HH services- would want to look at different [facility] agency - per PT/OT recommendations for [facility] - however wife is concerned about taking pt home due to frequency of suctioning- explained to wife- that if pt is needing frequent trach suctioning a SNF would not accept pt either- MD to order test to see about cause for secretions. CM to follow for d/c needs- anticipate return home with wife with [facility] services- will need orders for [facility] prior to discharge.   Expected Discharge Date:                  Expected Discharge Plan:  [facility]  In-House Referral:     Discharge planning Services  CM Consult  Post Acute Care Choice:  Home Health Choice offered to:  Spouse, Patient  DME Arranged:  N/A DME Agency:  NA  HH Arranged:  RN, PT, OT, Nurse's Aide, Speech Therapy [place] Agency:  Other - See comment  Status of Service:  Completed, signed off  If discussed at [place] of Stay Meetings, dates discussed:    Discharge Disposition: home/home health   Additional Comments:  03/24/17- 1400- Adam Lem RN, CM- pt for possible d/c today- orders have been placed for HHRN/PT/OT/aide/sw- spoke with pt and wife at bedside- confirmed plan to return home with [facility] services- choice offered for [facility] agencies in [facility]- wife does not want to use [facility] again- would like to try [place] - referral called to [place] at  [place] - however they can not accept referral at this time- upon return to the room- wife had contacted pt's PCP at the [facility] clinic and now would like to utilize his VA benefits and have the [place] set up his [facility] needs through his PCP at the [place]- have faxed [place] orders to his PCP at the [facility] for them to arrange needed [facility] services. (fax # [phone]- call received from d/c MD- regarding TF for home- as pt has not been set up with [facility] agency yet from [place]- pt can not get new TF formula from the [place] until next week- calls made to multiple local pharmacies- however no one has TF formula in stock for today- have faxed all new scripts to the [place] along with nutrition recommendations- but it will be next week before [facility] can supply pt with new formula pt and wife aware of this- have notified Dr. Berneice Gandy-- pt does have some of his other TF at home not sure if he could use this in the interim if the rate can be recalculated to fit as best as possible until [place] supplies new TF- MD to make this call.   Adam Patricia, RN 03/24/2017, 2:27 PM

## 2017-03-25 ENCOUNTER — Inpatient Hospital Stay (HOSPITAL_COMMUNITY): Payer: Non-veteran care

## 2017-03-25 DIAGNOSIS — J690 Pneumonitis due to inhalation of food and vomit: Secondary | ICD-10-CM

## 2017-03-25 DIAGNOSIS — K567 Ileus, unspecified: Secondary | ICD-10-CM

## 2017-03-25 LAB — GLUCOSE, CAPILLARY
GLUCOSE-CAPILLARY: 102 mg/dL — AB (ref 65–99)
GLUCOSE-CAPILLARY: 95 mg/dL (ref 65–99)
Glucose-Capillary: 162 mg/dL — ABNORMAL HIGH (ref 65–99)
Glucose-Capillary: 129 mg/dL — ABNORMAL HIGH (ref 65–99)
Glucose-Capillary: 113 mg/dL — ABNORMAL HIGH (ref 65–99)

## 2017-03-25 MED ORDER — FREE WATER
200.0000 mL | Freq: Once | Status: AC
  Administered 2017-03-25: 200 mL

## 2017-03-25 MED ORDER — OXYCODONE HCL 5 MG/5ML PO SOLN
5.0000 mg | Freq: Once | ORAL | Status: AC
  Administered 2017-03-25: 5 mg
  Filled 2017-03-25: qty 5

## 2017-03-25 MED ORDER — SODIUM CHLORIDE 0.9 % IV SOLN
3.0000 g | Freq: Four times a day (QID) | INTRAVENOUS | Status: DC
  Administered 2017-03-25 – 2017-03-26 (×2): 3 g via INTRAVENOUS
  Filled 2017-03-25 (×5): qty 3

## 2017-03-25 MED ORDER — METHYLENE BLUE 0.5 % INJ SOLN
1.0000 mL | Freq: Once | INTRAVENOUS | Status: AC
  Administered 2017-03-25: 5 mg
  Filled 2017-03-25: qty 10

## 2017-03-25 NOTE — Progress Notes (Signed)
Internal Medicine Attending  Date: 03/25/2017  Patient name: Adam Chambers. Medical record number: [id number] Date of birth: 10/03/1940 Age: 76 y.o. Gender: male  I saw and evaluated the patient. I reviewed the resident's note by Dr. Berneice Gandy and I agree with the resident's findings and plans as documented in her progress note.  When seen on rounds this morning Adam Chambers was resting comfortably. He had some abdominal pain overnight and was given an enema with positive results. At the time of my visit his pain had improved. He subsequently developed an increase in foul-smelling secretions from his tracheal stoma. A chest x-ray revealed a right nasal lateral infiltrate slightly worse than on July 30. There was an associated right-sided pleural effusion. This was unchanged from the film on July 30. The enteral tube is quite distal and I am doubtful that he is aspirating from the lower GI tract. Nonetheless, to test this possibility, we have added methylene blue to the water flushes to see if the tracheal secretions turn blue. My suspicion is that the right lateral infiltrate is secondary to aspiration of oral secretions. He is therefore being started on Unasyn for this. Soon after I saw him this morning his abdominal pain return. A KUB revealed a colonic ileus. There was no free air, no air-fluid levels within the bowel, and not a significant stool burden. We will be obtaining a BMP with a calcium, phosphorus, and magnesium in the morning labs. We will also hold tube feeds overnight given the colonic ileus. Tomorrow morning on rounds we will try to better understand the reason Adam Chambers is not participating in physical therapy. We will remind him of the multiple benefits to this including help with his bowels. With the colonic ileus, if he does not have electrolyte abnormalities that we can replace, we will consider opiates is a potential cause. He may be a candidate for the new constipation medication particularly  geared towards opiate-induced constipation.  We continue to await the VA to set up home health services.

## 2017-03-25 NOTE — Progress Notes (Signed)
Pharmacy Antibiotic Note  Adam Chambers. is a 76 y.o. male who presented on 03/17/2017 with AMS. Pharmacy now consulted to start antibiotics for empiric aspiration PNA coverage.   Plan: 1. Start Unasyn 3g IV every 6 hours 2. Will continue to follow renal function, culture results, LOT, and antibiotic de-escalation plans   Height: 6\' 4"  (193 cm) Weight: 164 lb 3.9 oz (74.5 kg) IBW/kg (Calculated) : 86.8  Temp (24hrs), Avg:98.1 F (36.7 C), Min:97.8 F (36.6 C), Max:98.6 F (37 C)   Recent Labs Lab 03/22/17 0333 03/23/17 0211 03/24/17 0522 03/24/17 1141 03/24/17 1827  CREATININE 1.04 0.86 1.46* 0.87 0.82    Estimated Creatinine Clearance: 82 mL/min (by C-G formula based on SCr of 0.82 mg/dL).    Allergies  Allergen Reactions  . Simvastatin Nausea And Vomiting    Antimicrobials this admission: Vanc 7/27 x 1 Zosyn 7/27 x 1 Unasyn 8/4 >>  Microbiology results: 7/27 BCx >> NGf  Thank you for allowing pharmacy to be a part of this patient's care.  Alycia Rossetti, PharmD, BCPS Clinical Pharmacist Pager: [phone] 03/25/2017 6:41 PM

## 2017-03-25 NOTE — Progress Notes (Signed)
Subjective:  Adam Chambers was seen sitting on bedside commode today during rounds. He had stomach pain overnight, which he thought was due to not having a bowel movement for the past week. His overnight feed was stopped and he was given an enema to help him have a bowel movement. Patient otherwise feels about the same today as yesterday and his only complaint is that he still has to be in the hospital today.    Objective:  Vital signs in last 24 hours: Vitals:   03/25/17 0411 03/25/17 0548 03/25/17 0558 03/25/17 0753  BP:   122/64   Pulse: 77  (!) 108 98  Resp: 16  15 16   Temp:  97.8 F (36.6 C) 97.8 F (36.6 C)   TempSrc:  Oral Oral   SpO2: 96%  (!) 65% 93%  Weight:   164 lb 3.9 oz (74.5 kg)   Height:       Physical Exam  Constitutional:  Chronically ill appearing and thin man sitting on bedside commode in no acute distress with trach collar over stoma site.  HENT:  Stoma site non erythematous and without significant secretions.   Cardiovascular: Normal rate, regular rhythm, normal heart sounds and intact distal pulses.   No murmur heard. Pulmonary/Chest: Effort normal and breath sounds normal. No respiratory distress.  Abdominal: Soft. He exhibits no distension. There is no tenderness.  Musculoskeletal: He exhibits no edema (of bilateral lower extremities) or tenderness (of bilateral lower extremities).  Neurological:  Gross motor of upper and lower extremities intact bilaterally.  Skin: Skin is warm and dry. No erythema. No pallor.  Good skin turgor  Psychiatric: He has a normal mood and affect. His behavior is normal. Judgment normal.   Assessment/Plan:  Mr. Markice Torbert is a 76 yo with a PMH of HTN, CHF, tonsillar cancer, and esophageal cancer who was hospitalized with AMS in the setting of decreased PO intake after salvage esophagectomy and laryngopharygectomy about a month prior to presentation. Leading up to admission she reported decreased PO intake and diarrhea with tube  feeds.The specific problems addressed during this admission are as follows.   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 Per recommendations from ENT the patient will remain strictly NPO for at least 6 weeks and continue aggressive nutrition via g-tube. The patient will follow up with ENT for management of this fistula tract. The patient has complaints of increased pain after exam and was given oxycodone 10 mg for pain management. -Continue 10 mg q4 hours for pain via tube -Continue clearance of secretions PRN -Follow up with ENT as an outpatient in 2 weeks from 8/3 consultation.  S/P Salvage Esophagectomy and Laryngopharygectomy on 6/22 with dumping syndrome, electrolyte disturbances, and protein-calorie malnutrition: Patient tolerating tube feeds without any diarrhea and meeting all goals.  -BMP, Mg, Phos this AM showed electrolytes within normal limts on 8/3 [ ]  Repeat BMP, Mg, Phos on 8/5 -Feed stopped prematurely on 8/4 around 0500 for patient's enema -Continue vernight feeding regimen of Vital1.5 @95  ml/hr for 15 hours starting at 1800, with free water flushes initiated on 8/3  Situational Depression: Effexor initiated on 03/24/2017. Patient will need to follow up with PCP regarding formal depression diagnosis and continuing treatment.  - Start venlafaxine 37.5 mg BID via tube  Hypothyroidism: The patient's TSH=86.9 and free Tr=0.49, suggesting hypothyroidism -Continue levothyroxine 100mg  via tube -Recheck  thyroid function outpatient in 6 weeks  HTN: well controlled on home regimen -Continue amlodipine 10mg  qd and metoprolol 50mg  bid  Dispo: Patient medically stable for discharge but discharge delayed 2/2 establishing home health through the [place] system with case management  Thomasene Ripple,  MD 03/25/2017, 10:45 AM Pager: [phone]

## 2017-03-25 NOTE — Progress Notes (Signed)
Bolus of 200 Free water with dye given via peg tube. Dr Berneice Gandy notified. Will continue to monior. [name]

## 2017-03-25 NOTE — Progress Notes (Signed)
Called by nurse because patient had increased, foul smelling secretions from stoma site. The nurse first noticed these increased secretions when giving the patient his first scheduled free water flush for the day. The patient given enema this AM and subsequently had bowel movement with good relief of abdominal pain. Nurse and wife bedside think these secretions smell similar to his bowel movement.   Team arrived at bedside for assessment and noticed green discharge with some sediment in wall aspiration container. Patient was tender to palpation on abdominal exam in all four quadrants. His belly was soft and not distended. The patient had no other acute complaints  There is concern for aspiration pneumonia, since the patient has a known fistula and was reportedly not strictly adhering to NPO orders early in hospitalization. We will obtain a 2 view chest x ray to rule out this possibility. To rule out SBO, a KUB will also be obtained. Since the patient's increased secretions were first noticed with free water flushes, we want to ensure there isn't a malfunction of the patients enteric tube. We will add methylene blue to his next fee water flush and watch for increased blue secretions out of the stoma site after flush is complete to rule out enteric tube malfunction.

## 2017-03-26 DIAGNOSIS — T8132XA Disruption of internal operation (surgical) wound, not elsewhere classified, initial encounter: Secondary | ICD-10-CM

## 2017-03-26 DIAGNOSIS — T8130XA Disruption of wound, unspecified, initial encounter: Secondary | ICD-10-CM | POA: Diagnosis present

## 2017-03-26 LAB — URINALYSIS, ROUTINE W REFLEX MICROSCOPIC
BILIRUBIN URINE: NEGATIVE
GLUCOSE, UA: NEGATIVE mg/dL
Hgb urine dipstick: NEGATIVE
Ketones, ur: NEGATIVE mg/dL
LEUKOCYTES UA: NEGATIVE
NITRITE: POSITIVE — AB
PH: 7 (ref 5.0–8.0)
Protein, ur: NEGATIVE mg/dL
SPECIFIC GRAVITY, URINE: 1.01 (ref 1.005–1.030)

## 2017-03-26 LAB — PHOSPHORUS: PHOSPHORUS: 2.4 mg/dL — AB (ref 2.5–4.6)

## 2017-03-26 LAB — GLUCOSE, CAPILLARY
GLUCOSE-CAPILLARY: 101 mg/dL — AB (ref 65–99)
GLUCOSE-CAPILLARY: 106 mg/dL — AB (ref 65–99)
Glucose-Capillary: 101 mg/dL — ABNORMAL HIGH (ref 65–99)
Glucose-Capillary: 96 mg/dL (ref 65–99)
Glucose-Capillary: 126 mg/dL — ABNORMAL HIGH (ref 65–99)

## 2017-03-26 LAB — URINALYSIS, MICROSCOPIC (REFLEX)

## 2017-03-26 LAB — BASIC METABOLIC PANEL
ANION GAP: 6 (ref 5–15)
BUN: 12 mg/dL (ref 6–20)
CO2: 36 mmol/L — ABNORMAL HIGH (ref 22–32)
Calcium: 8.4 mg/dL — ABNORMAL LOW (ref 8.9–10.3)
Chloride: 84 mmol/L — ABNORMAL LOW (ref 101–111)
Creatinine, Ser: 0.8 mg/dL (ref 0.61–1.24)
GLUCOSE: 98 mg/dL (ref 65–99)
POTASSIUM: 4.3 mmol/L (ref 3.5–5.1)
SODIUM: 126 mmol/L — AB (ref 135–145)

## 2017-03-26 LAB — MAGNESIUM: MAGNESIUM: 2 mg/dL (ref 1.7–2.4)

## 2017-03-26 MED ORDER — FREE WATER: 240.0000 mL | Freq: Four times a day (QID) | Status: DC

## 2017-03-26 MED ORDER — FREE WATER
240.0000 mL | Freq: Four times a day (QID) | Status: DC
  Administered 2017-03-27 (×2): 240 mL

## 2017-03-26 MED ORDER — METHYLENE BLUE 0.5 % INJ SOLN
2.5000 mL | INTRAVENOUS | Status: DC
  Administered 2017-03-26: 12.5 mg
  Filled 2017-03-26 (×8): qty 10

## 2017-03-26 MED ORDER — METHYLENE BLUE 0.5 % INJ SOLN: 1.0000 mL | INTRAVENOUS | Status: DC

## 2017-03-26 MED ORDER — METHYLENE BLUE 0.5 % INJ SOLN
1.0000 mL | Freq: Four times a day (QID) | INTRAVENOUS | Status: DC
  Filled 2017-03-26: qty 10

## 2017-03-26 MED ORDER — VITAL 1.5 CAL PO LIQD
1000.0000 mL | ORAL | Status: DC
  Administered 2017-03-26: 1000 mL
  Filled 2017-03-26 (×2): qty 1000

## 2017-03-26 MED ORDER — FREE WATER
200.0000 mL | Status: DC
  Administered 2017-03-26: 200 mL
  Filled 2017-03-26 (×3): qty 200

## 2017-03-26 MED ORDER — SODIUM CHLORIDE 0.9 % IV SOLN
1.0000 g | Freq: Three times a day (TID) | INTRAVENOUS | Status: DC
  Administered 2017-03-26 – 2017-03-27 (×4): 1 g via INTRAVENOUS
  Filled 2017-03-26 (×6): qty 1

## 2017-03-26 MED ORDER — FREE WATER
240.0000 mL | Status: DC
Start: 2017-03-26 — End: 2017-03-26

## 2017-03-26 NOTE — Progress Notes (Signed)
      [address]       [place] [address]             [phone]      Asked to see patient for pharyngeal-tracheal fistula.  76 yo AAM with proximal esophageal cancer. Treated with XRT, some type of cryotherapy and then laryngopharyngectomy with a gastric pull up at [facility] on 6/22. About 2 weeks ago wife noted that food he took PO was coming through the tracheostomy stoma. On day of admission was lethargic. No fever or chills. Had a normal WBC.  Dr. Erik Obey did a flexible laryngoscopy that revealed the gastric mucosa was viable. There was a probable fistulous tract just above the suture line. A barium swallow revealed a small leak high in the neck.  This appears to be an otorhinolaryngology issue. I do not see a role for thoracic surgery in the management of this patient at this time. If Dr. Erik Obey feels we can assist him we would be happy to do so.  Thank you for this interesting consult.  Revonda Standard Roxan Hockey, MD [facility] [phone]

## 2017-03-26 NOTE — Progress Notes (Signed)
Internal Medicine Attending  Date: 03/26/2017  Patient name: Adam Chambers. Medical record number: [id number] Date of birth: 1940-10-03 Age: 76 y.o. Gender: male  I saw and evaluated the patient. I reviewed the resident's note by Dr. Berneice Gandy and I agree with the resident's findings and plans as documented in her progress note.  When seen on rounds this morning the stomal output was considerable. He had at least 250 mL out within a matter of 2 hours. This fluid was a dark tan in color. Fortunately, the patient was oxygenating well on 35% and I believe this could be lowered further without problem. We remain very concerned about a fistula between the stomach within the mediastinum and the stoma, but knew we could not obtain a Gastrografin study because of the tracheopharyngeal fistula seen by ENT, requiring him to be strictly nothing by mouth. We therefore called GI who reviewed the report from the previous esophagram and barium swallow. It was then brought to our attention that the report had been addended from essentially no evidence of tracheoesophageal fistula to the following: "There is a focal outpouching of contrast immediately anterior to the mid to distal aspect of the patient's cervical spinal fusion hardware. This persists after transit of the contrast bolus, and is clearly extraluminal outside the confines of the cervical esophagus. This corresponds to the reported false passage in this region on recent flexible laryngoscopy, concerning for dehiscence at or near the anastomosis for the gastric pull-through." Unfortunately, we were not made aware of this addendum that was placed on August 3 at 12:49 PM, 4 days after the original report. This information confirmed our concerns for a fistula between the gastric pull-through and the stoma. We called GI surgery to assess and they told us this was a thoracic surgery issue. We called thoracic surgery who evaluated the patient and because the lesion was  extrathoracic were informed this is an ENT issue. We will discuss the addended barium swallow with Dr. Warren Danes to see if this changes his recommendations. We also called Dr. Loni Dolly, the ENT surgeon at [facility] in [place] at the following number [phone]. His voice mail was full and not taking messages. We therefore called the main number at [facility] in [place] at [phone]. They were able to connect [place] with Dr. Loni Dolly. Dr. Loni Dolly had yet to speak with Dr. Sabra Heck, the thoracic surgeon in [place], but preliminarily would consider accepting Adam Chambers in transfer to [facility] to address this issue. I spoke with Mr. and Mrs. Pacer at length about thoracic surgery's opinion, my concerns about dehiscence, and my conversation with Dr. Loni Dolly in [place]. They prefer to have this issue addressed in [place]. As of this moment the stomal output has markedly decreased and he has not required suctioning for a few hours. I believe he would be stable for transport, but would want to do this formally on oxygen with suction immediately available. They wanted to know what the potential costs would be as they would consider driving themselves. I discussed my concerns about this idea raising the point that if she was in the middle of nowhere in [place] and he had a large output through his stoma, that she could not quickly suction, they could be in serious trouble. I told him we would try to get an estimate for the cost of transport to [facility] so they could make an informed decision. Dr. Loni Dolly told me I could text him and I updated him, without  using any patient identifiers, as to thoracic surgery's opinion, our plan to rediscuss the case with Dr. Warren Danes, and the patient and his wife's preference for care in [place]. We will contact him once again tomorrow once we have some of the above issues addressed to see if he has had an opportunity to discuss transfer with Dr. Sabra Heck.  We  will continue to wean the oxygen to the lowest possible level to keep the O2 sat approximately 92%. This should save on the costs of oxygen during the transport. We have also restarted his tube feeds at half the rate overnight given the need for nutritional support, especially if he's to have surgical repair of the presumed dehisced lesion. The methylene blue water flushes did not show in the stomal suctioning and confirmed our suspicion this was not the source of the stomal output. We have also converted the Unasyn to meropenem given the concern for wound dehiscence and gastric secretions therefore entering into the lung.

## 2017-03-26 NOTE — Progress Notes (Signed)
Subjective: Adam Chambers was seen lying in bed this morning. He appeared unhappy and states that he is tired all the time. He is bothered by the increased secretions coming from his stoma and states that he is in pain all the time.  Objective:  Vital signs in last 24 hours: Vitals:   03/26/17 1148 03/26/17 1324 03/26/17 1514 03/26/17 1635  BP: 132/87  125/81   Pulse:   65 66  Resp:   18 (!) 21  Temp: 98.8 F (37.1 C) 97.8 F (36.6 C)    TempSrc: Oral Oral    SpO2: 94%  96% 98%  Weight:      Height:       Physical Exam  Constitutional:  Thin man sitting comfortably in bed in no acute distress. Trach collar in place with suctioning bedside.  HENT:  Stoma with increased yellow, foul smelling secretions. The patient's bedside suction container has 400 ml of yellow green fluid and yellow sediment.   Cardiovascular: Normal rate, regular rhythm, normal heart sounds and intact distal pulses.   No murmur heard. Pulmonary/Chest: Effort normal and breath sounds normal. No respiratory distress. He has no wheezes.  Abdominal: Soft. He exhibits no distension. There is tenderness (in all four quadrants; worse in lower quadrants).  Musculoskeletal: He exhibits no edema (of bilaterally lower extremities) or tenderness (of bilaterally lower extremities).  Skin: Skin is warm and dry. Capillary refill takes less than 2 seconds. No erythema. No pallor.  Psychiatric:  Patient has flat affect and has minimal participation during exam.   Assessment/Plan:  Principal Problem:   Altered mental status Active Problems:   Tonsil cancer (Adam Chambers)   Hypothyroidism   Hypertension   Protein malnutrition (Adam Chambers)   Status post tracheostomy (Adam Chambers)   Hyponatremia   Persistent postoperative fistula   Current severe episode of major depressive disorder without psychotic features without prior episode (Adam Chambers)   Aspiration pneumonia of right lower lobe (Adam Chambers)   Dynamic ileus (Adam Chambers)   Wound dehiscence  [name] is a  76 yo with a PMH of HTN, CHF, tonsillar cancer, and esophageal cancer who was hospitalized with AMS in the setting of decreased PO intake after salvage esophagectomy and laryngopharygectomy about a month prior to presentation. Leading up to admission she reported decreased PO intake and diarrhea with tube feeds.The specific problems addressed during this admission are as follows.   Concern for wound dehiscence with possible communication to trache: After the patient had a bowel movement in the morning of 8/4, he started complaining of increased foul smelling stomal secretions. On 8/4 a chest X Ray and KUB were obtained for initial workup of these secretions. These studies showed a worsening aspiration pneumonia and colonic dilation concerning for ileus. Further chart review showed a revised Radiology report from a swallow study performed on 7/31. This study was initially obtained to evaluate for possible TE fistula. The original report showed no evidence of definite fistula, but it could not be ruled out. After this study ENT was consulted for evaluation of possible fistula with laryngoscopy on 8/2. Per chart review the consulting physician contacted the radiologist regarding the results of her evaluation and an addendum of this initial study was posted on 8/3. The attending and primary team were not notified of report addendum which stated the 7/31 study did indeed showed wound dehiscence at/near the anastomosis of the gastric pull through. The patient's surgeons at [facility], specifically his ENT physician Aundra Millet (contacted) and thoracic surgeon  Holley Raring (unable to contact) where told of potential wound dehiscence on 8/5. They plan to speak with one another and determine whether they would accept a transfer for surgical evaluation, if this is what the patient desires and if he is medically stable for transfer. In the meantime, the patient will restart his tube feeds, as his nutrition  is imperative to promote healing. These will be started at a reduced rate overnight as discussed below. -Cardiothoracic surgery consulted, appreciate recommendations -ENT consulted, recommends NPO for 6 week, follow up in 2 weeks, and aggressive nutrition for healing of potential fistula. With new read from radiology concerning for wound dehiscence, we will contact ENT on 8/6 for consultation to re-evaluate for possible surgery -Continue 10 mg oxycodone q4 hours for pain via tube -Continue clearance of secretions PRN  Dumping syndrome, electrolyte disturbances, and protein-calorie malnutrition: Patient's tube feeds were held overnight on 8/4 secondary to known ileus. The patient needs aggressive nutrition to promote wound healing.  -Continue vernight feeding regimen of Vital1.5 @ 42 ml/hr for 15 hours starting at 1800, with 240 mL free water flushes QID  -Have low threshold for stopping tube feeds overnight if the patient cannot tolerate tube feeds 2/2 abdominal pain. -BMP in AM  Situational Depression: Effexor initiated on 03/24/2017. Patient will need to follow up with PCP regarding formal depression diagnosis and continuing treatment.  - Start venlafaxine 37.5 mg BID via tube  Hypothyroidism: The patient's TSH=86.9 and free Tr=0.49, suggesting hypothyroidism -Continue levothyroxine 100mg  via tube -Recheck thyroid function outpatient in 6 weeks  HTN: well controlled on home regimen -Continue amlodipine 10mg  qd and metoprolol 50mg  bid  VTE Prophylaxis: -Lovenox 40 mg daily  FEN/GI: -NPO, nutrition/hydration via tube feeds and free water flushes  Dispo: Anticipated discharge pending clinical improvement or transfer to outside facility for possible surgical intervention  Thomasene Ripple, MD 03/26/2017, 5:07 PM Pager: [phone]

## 2017-03-26 NOTE — Progress Notes (Signed)
Pt is presenting with copious amounts of greenish/brown bile coming out of stoma. This is fowel smelling and needs constant suctioning. This Probation officer is notifying md's of situation and requesting NG tube for pt. Family at bedside. Paged MD>

## 2017-03-26 NOTE — Progress Notes (Signed)
Pharmacy Antibiotic Note  Adam Chambers. is a 76 y.o. male who presented on 03/17/2017 with AMS. Pharmacy now consulted to dose meropenem for HCAP.   Plan: 1. Start Meropenem 1g q8h for HCAP 2. Monitor clinic progress, renal function, electrolytes 3. F/u BCx, C/S, future de-escalation, & length of therapy   Height: 6\' 4"  (193 cm) Weight: 164 lb 3.9 oz (74.5 kg) IBW/kg (Calculated) : 86.8  Temp (24hrs), Avg:98.3 F (36.8 C), Min:97.8 F (36.6 C), Max:98.8 F (37.1 C)   Recent Labs Lab 03/23/17 0211 03/24/17 0522 03/24/17 1141 03/24/17 1827 03/26/17 0241  CREATININE 0.86 1.46* 0.87 0.82 0.80    Estimated Creatinine Clearance: 84.1 mL/min (by C-G formula based on SCr of 0.8 mg/dL).    Allergies  Allergen Reactions  . Simvastatin Nausea And Vomiting    Antimicrobials this admission: Vanc 7/27 x 1 Zosyn 7/27 x 1 Unasyn 8/4 >>8/5 Meropenem 8/5>>  Microbiology results: 7/27 BCx >> NGTD  Thank you for allowing pharmacy to be a part of this patient's care.  Nida Boatman, PharmD PGY1 Acute Care Pharmacy Resident Pager: [phone] 03/26/2017 2:41 PM

## 2017-03-27 LAB — GLUCOSE, CAPILLARY
GLUCOSE-CAPILLARY: 130 mg/dL — AB (ref 65–99)
GLUCOSE-CAPILLARY: 133 mg/dL — AB (ref 65–99)
GLUCOSE-CAPILLARY: 150 mg/dL — AB (ref 65–99)
Glucose-Capillary: 146 mg/dL — ABNORMAL HIGH (ref 65–99)
Glucose-Capillary: 94 mg/dL (ref 65–99)

## 2017-03-27 LAB — BASIC METABOLIC PANEL
Anion gap: 10 (ref 5–15)
BUN: 18 mg/dL (ref 6–20)
CHLORIDE: 84 mmol/L — AB (ref 101–111)
CO2: 31 mmol/L (ref 22–32)
Calcium: 8.4 mg/dL — ABNORMAL LOW (ref 8.9–10.3)
Creatinine, Ser: 1.17 mg/dL (ref 0.61–1.24)
GFR calc Af Amer: >60 mL/min (ref >60)
GFR calc non Af Amer: 59 mL/min — ABNORMAL LOW (ref >60)
GLUCOSE: 132 mg/dL — AB (ref 65–99)
POTASSIUM: 4.3 mmol/L (ref 3.5–5.1)
Sodium: 125 mmol/L — ABNORMAL LOW (ref 135–145)

## 2017-03-27 LAB — OSMOLALITY, URINE: Osmolality, Ur: 390 mOsm/kg (ref 300–900)

## 2017-03-27 LAB — TSH: TSH: 59.001 u[IU]/mL — ABNORMAL HIGH (ref 0.350–4.500)

## 2017-03-27 LAB — OSMOLALITY: Osmolality: 268 mOsm/kg — ABNORMAL LOW (ref 275–295)

## 2017-03-27 LAB — MAGNESIUM: Magnesium: 2 mg/dL (ref 1.7–2.4)

## 2017-03-27 LAB — PHOSPHORUS: Phosphorus: 3.2 mg/dL (ref 2.5–4.6)

## 2017-03-27 LAB — SODIUM, URINE, RANDOM: SODIUM UR: 73 mmol/L

## 2017-03-27 MED ORDER — VITAL 1.5 CAL PO LIQD
1000.0000 mL | ORAL | Status: DC
  Filled 2017-03-27: qty 1000

## 2017-03-27 NOTE — Progress Notes (Signed)
PT Cancellation Note  Patient Details Name: Adam Chambers. MRN: [id number] DOB: 11-12-1940   Cancelled Treatment:    Reason Eval/Treat Not Completed: Patient declined, no reason specified PT will continue to follow acutely.    Salina April, PTA Pager: [phone]   03/27/2017, 2:51 PM

## 2017-03-27 NOTE — Progress Notes (Signed)
Internal Medicine Attending  Date: 03/27/2017  Patient name: Adam Chambers. Medical record number: [id number] Date of birth: 05-Jul-1941 Age: 76 y.o. Gender: male  I saw and evaluated the patient. I reviewed the resident's note by Dr. Berneice Gandy and I agree with the resident's findings and plans as documented in her progress note.  [name] looked better this morning when seen on rounds. He was without any new complaints. The stomal output appeared to have decreased. We appreciate ENT's input. Apparently, he has been accepted at [facility] in [place] and transportation is being arranged. I have been told he will be leaving today at 8 PM to make the trip to [place]. We will try to wean the oxygen to optimize the flow rate for transport. Further management of his dehiscence will be taken over by [facility].

## 2017-03-27 NOTE — Progress Notes (Signed)
Off-going RN provided report to transfer facility and education to family prior to shift change. EMS arrived to transport pt to [facility] center, EMS received all signed paperwork prior to pt transport.  Pt family requested pain medication for pt generalized chronic pain prior to transport, pain medication provided. Pt family provided with pt belonging bags, pt wife in possession of all pt belongings. Pt transferred to stretcher by EMS, condom catheter drainage bag emptied prior to transport.    Kielan, Dreisbach, RN 03/27/2017

## 2017-03-27 NOTE — Progress Notes (Signed)
03/27/2017 9:58 AM  Adam Chambers [id number]     Temp:  [97.8 F (36.6 C)-98.8 F (37.1 C)] 98.3 F (36.8 C) (08/06 0824) Pulse Rate:  [65-75] 66 (08/06 0824) Resp:  [18-21] 20 (08/06 0824) BP: (81-132)/(48-87) 105/48 (08/06 0824) SpO2:  [94 %-98 %] 95 % (08/06 0824) FiO2 (%):  [35 %-40 %] 35 % (08/06 0810) Weight:  [72.5 kg (159 lb 13.3 oz)] 72.5 kg (159 lb 13.3 oz) (08/06 0407),     Intake/Output Summary (Last 24 hours) at 03/27/17 0958 Last data filed at 03/27/17 0730  Gross per 24 hour  Intake              360 ml  Output              800 ml  Net             -440 ml    Results for orders placed or performed during the hospital encounter of 03/17/17 (from the past 24 hour(s))  Glucose, capillary     Status: Abnormal   Collection Time: 03/26/17 11:30 AM  Result Value Ref Range   Glucose-Capillary 101 (H) 65 - 99 mg/dL   Comment 1 Notify RN    Comment 2 Document in Chart   Glucose, capillary     Status: None   Collection Time: 03/26/17  4:13 PM  Result Value Ref Range   Glucose-Capillary 96 65 - 99 mg/dL  Urinalysis, Routine w reflex microscopic     Status: Abnormal   Collection Time: 03/26/17  4:59 PM  Result Value Ref Range   Color, Urine GREEN (A) YELLOW   APPearance CLEAR CLEAR   Specific Gravity, Urine 1.010 1.005 - 1.030   pH 7.0 5.0 - 8.0   Glucose, UA NEGATIVE NEGATIVE mg/dL   Hgb urine dipstick NEGATIVE NEGATIVE   Bilirubin Urine NEGATIVE NEGATIVE   Ketones, ur NEGATIVE NEGATIVE mg/dL   Protein, ur NEGATIVE NEGATIVE mg/dL   Nitrite POSITIVE (A) NEGATIVE   Leukocytes, UA NEGATIVE NEGATIVE  Urinalysis, Microscopic (reflex)     Status: Abnormal   Collection Time: 03/26/17  4:59 PM  Result Value Ref Range   RBC / HPF 0-5 0 - 5 RBC/hpf   WBC, UA 0-5 0 - 5 WBC/hpf   Bacteria, UA MANY (A) NONE SEEN   Squamous Epithelial / LPF 6-30 (A) NONE SEEN   Budding Yeast PRESENT    Ca Oxalate Crys, UA PRESENT   Glucose, capillary     Status: Abnormal   Collection  Time: 03/26/17  9:00 PM  Result Value Ref Range   Glucose-Capillary 126 (H) 65 - 99 mg/dL   Comment 1 Notify RN    Comment 2 Document in Chart   Glucose, capillary     Status: Abnormal   Collection Time: 03/27/17 12:16 AM  Result Value Ref Range   Glucose-Capillary 150 (H) 65 - 99 mg/dL   Comment 1 Notify RN    Comment 2 Document in Chart   Glucose, capillary     Status: Abnormal   Collection Time: 03/27/17  4:12 AM  Result Value Ref Range   Glucose-Capillary 133 (H) 65 - 99 mg/dL   Comment 1 Notify RN    Comment 2 Document in Chart   Basic metabolic panel     Status: Abnormal   Collection Time: 03/27/17  4:53 AM  Result Value Ref Range   Sodium 125 (L) 135 - 145 mmol/L   Potassium 4.3 3.5 - 5.1 mmol/L  Chloride 84 (L) 101 - 111 mmol/L   CO2 31 22 - 32 mmol/L   Glucose, Bld 132 (H) 65 - 99 mg/dL   BUN 18 6 - 20 mg/dL   Creatinine, Ser 1.17 0.61 - 1.24 mg/dL   Calcium 8.4 (L) 8.9 - 10.3 mg/dL   GFR calc non Af Amer 59 (L) >60 mL/min   GFR calc Af Amer >60 >60 mL/min   Anion gap 10 5 - 15  Magnesium     Status: None   Collection Time: 03/27/17  4:53 AM  Result Value Ref Range   Magnesium 2.0 1.7 - 2.4 mg/dL  Phosphorus     Status: None   Collection Time: 03/27/17  4:53 AM  Result Value Ref Range   Phosphorus 3.2 2.5 - 4.6 mg/dL  Glucose, capillary     Status: Abnormal   Collection Time: 03/27/17  8:48 AM  Result Value Ref Range   Glucose-Capillary 146 (H) 65 - 99 mg/dL   Prealbumin 7.4!  SUBJECTIVE:  Pt has had additional stomal production, possibly anaerobic.  C/o pain after my endoscopic procedure last week.  Medical team has talked with Dr. Loni Dolly, ENT surgeon at [facility] in [place].  I have not had the chance to speak with him yet.  Dr. Roxan Hockey consulted and did not feel there was any further role given an apparently viable gastic pull up.  Methylene blue in feeding did not seem to present in the stomal secretions.  I assisted the Radiologists  in identifying the extraluminal contrast on last week's gastrografin swallow study.  OBJECTIVE:    No change in exam  IMPRESSION:  Wound dehiscence with fistula at stoma. Hypothyroidism.  Malnutrition.  Surprising with his npo status that he is still having increasing aspiration problems.  No clear evidence of reflux of his jejunostomy feedings, nor especially of a colonic fistula despite the possible anaerobic odor of the stomal secretions. He probably has an anaerobic aspiration pneumonia.    PLAN:  Aggressive attention to nutrition and thyroid replacment. I would like to see progress on his TSH before 6 weeks if possible.   I do not think any urgent surgery would necessarily be indicated or even successful.  I hope to speak with Dr Loni Dolly this AM.  If the transfer to [facility] seems prohibitive, I would recommend [facility]  I can help with making contact if this is useful.    I discussed this at length with pt and wife.  Adam Chambers

## 2017-03-27 NOTE — Progress Notes (Signed)
Assumed care from off going RN @ [phone]; patient resting during shift change; throughout shift, patient require frequent suction but showed no signs of acute distress; c/o about pain but given oxycodone liquid solution throughout shift; patient will be transferred to [facility] via EMS due to pick up at 8pm;   @1850  report given to charge nurse, Nada Boozer at [facility] center

## 2017-03-27 NOTE — Care Management Important Message (Signed)
Important Message  Patient Details  Name: Adam Chambers. MRN: [id number] Date of Birth: November 19, 1940   Medicare Important Message Given:  Yes    Rahm Minix Abena 03/27/2017, 10:26 AM

## 2017-03-27 NOTE — Progress Notes (Signed)
Subjective: [name] feels better this morning, stating he had decreased abdominal pain and tolerated his tube feed overnight. He thinks his secretions have decreased overnight, but has continued his suction of yellow-green material overnight. He denies chest pain, difficulty breathing, worsening abdominal pain, and pain with urination.  Objective:  Vital signs in last 24 hours: Vitals:   03/27/17 0407 03/27/17 0810 03/27/17 0824 03/27/17 1058  BP: 90/64  (!) 105/48 138/80  Pulse:  69 66   Resp:   20   Temp: 97.8 F (36.6 C)  98.3 F (36.8 C)   TempSrc: Oral  Oral   SpO2:  95% 95%   Weight: 159 lb 13.3 oz (72.5 kg)     Height:       Physical Exam  Constitutional: No distress.  Very thin, chronically sick appearing man in no acute distress.  HENT:  Stoma site with increased secretions which were easily suctioned. Trach collar in place with blow by oxygen.   Cardiovascular: Normal rate, regular rhythm and normal heart sounds.   No murmur heard. Pulmonary/Chest: Effort normal and breath sounds normal. No respiratory distress. He has no wheezes.  Abdominal: Soft. He exhibits no distension. There is no tenderness.  Skin: Skin is warm and dry. Capillary refill takes less than 2 seconds.   Assessment/Plan:  Principal Problem:   Altered mental status Active Problems:   Tonsil cancer ([place])   Hypothyroidism   Hypertension   Protein malnutrition ([place])   Status post tracheostomy ([place])   Hyponatremia   Persistent postoperative fistula   Current severe episode of major depressive disorder without psychotic features without prior episode ([place])   Aspiration pneumonia of right lower lobe ([place])   Dynamic ileus ([place])   Wound dehiscence  [name] is a 76 yo with a PMH of HTN, CHF, tonsillar cancer, and esophageal cancer who was hospitalized with AMS in the setting of decreased PO intake after salvage esophagectomy and laryngopharygectomy about a month prior to presentation. Leading  up to admission she reported decreased PO intake and diarrhea with tube feeds.The specific problems addressed during this admission are as follows.   Concern for wound dehiscence with possible communication to trache: The patient reports continued secretions from his stoma site, although they think they have improved from yesterday. These secretions are still green/yellow colored in nature. The patient was given methylene blue water in his G-tube yesterday to see if the increased secretions are possibly gastric. The patient has not produced blue secretions after this study, which is reassuring. We have nevertheless broadened his antibiotics to cover for gastric and oral flora, as it still possible his increased secretions represent bile from anastomotic leak given his surgical history. The patient and wife are interested in transfer to cancer centers of [place] regarding management of this surgical issue.  -Cardiothoracic surgery consulted, they suggest ENT may be useful for surgery -ENT consulted regarding wound dehiscence on 8/6 and they continue to recommend NPO for 6 weeks, ENT follow up, aggressive nutrition/hypothyroid replacement. They do not feel surgery is advisable at this time. -Surgeons at [facility] aware of possible dehiscence and considering transfer. -Continue meropenem, 1g per pharmacy recommendations -Continue10 mg oxycodone q4 hours for pain via tube -Continue clearance of secretions PRN  Dumping syndrome, electrolyte disturbances, and protein-calorie malnutrition: Patient's tube feeds were held overnight on 8/4 secondary to known ileus. The patient needs aggressive nutrition to promote wound healing. Patient tolerated tube feeds at 1/2 rate overnight. Plan to increase  to goal rate tonight if patient not transferred  -Nutrition consulted for tube feeds, appreciate recommendations -Increase to Vital1.5 @ 95 ml/hr for 15 hours starting at 1800, with 240 mL free water  flushes QID if nutrition does not have other recommendations -Have low threshold for stopping tube feeds overnight if the patient cannot tolerate tube feeds 2/2 abdominal pain. -BMP showed continued hyponatremia. Mg= 2.0 and Phos= 3.2. Patient hypovolemic on exam with urine osmlality= 390, urine sodium= 73, and blood osmolality = 268. This workup is consistent with severe hypothyroidism. Can test for adrenal insufficiency with morning cortisol as well. Should consider this if patient's BP stays hypotensive on multiple measurements.   Hypothyroidism: The patient's TSH=86.9 and free Tr=0.49, suggesting hypothyroidism. Given hyponatremia workup above, a new TSH was obtained on 8/6 to determine if hypothyroidism is making some improvement with therapy.  -Continue levothyroxine 100mg  via tube -Recheck thyroid on 8/6  Possible urinary infection: Patient was given methylene blue to discern whether his stomal secretions represent enteric flora. The nurse noticed brisk output of blue dye with first administration of methylene blue concerning for entero-vesicular fistula. A UA was obtained, which contained many bacteria, nitrites, without WBCs and increased squamous cells. Will obtain another urinalysis, since this first UA likely a dirty catch.  -Clean catch UA on 8/6  HTN: well controlled on home regimen -Continue amlodipine 10mg  qd and metoprolol 50mg  bid  Situational Depression: Effexor initiated on 03/24/2017.Patient will need to follow up with PCP regarding formal depression diagnosis and continuing treatment.  - Start venlafaxine 37.5 mg BID via tube  VTE Prophylaxis: -Lovenox 40 mg daily  FEN/GI: -NPO, nutrition/hydration via tube feeds and free water flushes  Dispo: Anticipated discharge or transfer of care pending clinic   Adam Ripple, MD 03/27/2017, 11:04 AM Pager: [phone]

## 2017-03-27 NOTE — Care Management Note (Signed)
Case Management Note Marvetta Gibbons RN, BSN Unit 4E-Case Manager [phone]  Patient Details  Name: Adam Chambers. MRN: [id number] Date of Birth: 1940-09-24  Subjective/Objective:     Pt admitted with PNA/sepsis hx of chronic trach              Action/Plan: PTA pt lived at home with wife- referral for [facility] needs- spoke with pt and wife at bedside- per wife pt is on home TF- get their supplies from [place] along with medications- pt has PCP- DR. Woods at the [facility], has had procedures at [facility]. Pt has had [place] services in past with [facility]- per wife not active currently with any HH services- would want to look at different [facility] agency - per PT/OT recommendations for [facility] - however wife is concerned about taking pt home due to frequency of suctioning- explained to wife- that if pt is needing frequent trach suctioning a SNF would not accept pt either- MD to order test to see about cause for secretions. CM to follow for d/c needs- anticipate return home with wife with [facility] services- will need orders for [facility] prior to discharge.   Expected Discharge Date:  03/27/17               Expected Discharge Plan:  Acute to Acute Transfer  In-House Referral:  Clinical Social Work  Discharge planning Services  CM Consult  Post Acute Care Choice:  Home Health Choice offered to:  Spouse, Patient  DME Arranged:  N/A DME Agency:  NA  HH Arranged:  RN, PT, OT, Nurse's Aide, Speech Therapy [place] Agency:  Other - See comment  Status of Service:  Completed, signed off  If discussed at [place] of Stay Meetings, dates discussed:    Discharge Disposition: acute to acute transfer  Additional Comments:  03/27/17- 1630- Marvetta Gibbons RN, CM- MDs have reached out to [facility]- and plan is to tx pt acute to acute have been notified by the [facility] that pt has been accepted by Dr. Kathleen Lime and has a room # 311 there in [place] at the [facility]- per MD feel like pt is not safe to travel by  private car but stable for transport via ground EMS have contacted a few agencies regarding transport- [facility] can provide transport to [facility] [employer] with pt's wife- who is agreeable to [facility]- will need to pay up front $3,388- was LOG is not an option from hospital. Wife to call [place] state and make arrangements. Bedside RN has # to call for report-  [facility] to be here at [facility] for transport to [facility]- MD team aware and to prepare d/c summary.   03/24/17- 1400- Ember Gottwald RN, CM- pt for possible d/c today- orders have been placed for HHRN/PT/OT/aide/sw- spoke with pt and wife at bedside- confirmed plan to return home with [facility] services- choice offered for [facility] agencies in [facility]- wife does not want to use [facility] again- would like to try [facility] - referral called to [place] at [place] - however they can not accept referral at this time- upon return to the room- wife had contacted pt's PCP at the [facility] clinic and now would like to utilize his VA benefits and have the [place] set up his [place] needs through his PCP at the [place]- have faxed [place] orders to his PCP at the [facility] for them to arrange needed [facility] services. (fax # [phone]) [phone]- call received  from d/c MD- regarding TF for home- as pt has not been set up with [facility] agency yet from [place]- pt can not get new TF formula from the [place] until next week- calls made to multiple local pharmacies- however no one has TF formula in stock for today- have faxed all new scripts to the [place] along with nutrition recommendations- but it will be next week before [facility] can supply pt with new formula pt and wife aware of this- have notified Dr. Berneice Gandy-- pt does have some of his other TF at home not sure if he could use this in the interim if the rate can be recalculated to fit as best as possible until [place] supplies new TF- MD to make this call.   Dawayne Patricia, RN 03/27/2017, 4:55 PM

## 2017-03-27 NOTE — Progress Notes (Signed)
Nutrition Consult / Follow-up  DOCUMENTATION CODES:   Severe malnutrition in context of chronic illness  INTERVENTION:    Continue nocturnal feedings via J-tube  Increase Vital 1.5 rate to 95 ml/h x 15 hours  Provides 1425 ml, 2138 kcal, 96 gm protein, 1089 ml free water daily (2049 total ml free water with additional flushes)  Free water dosing per MD, with current low sodium may not need as much free water  NUTRITION DIAGNOSIS:   Malnutrition (Severe) related to chronic illness, cancer and cancer related treatments (dysphagia) as evidenced by severe depletion of body fat, severe depletion of muscle mass.  Ongoing  GOAL:   Patient will meet greater than or equal to 90% of their needs  Progressing with increase in nocturnal TF rate  MONITOR:   TF tolerance, Vent status, Diet advancement, Weight trends, I & O's, Skin  REASON FOR ASSESSMENT:   Consult Enteral/tube feeding initiation and management  ASSESSMENT:   76 yo male admitted with AMS, diarrhea with hypokalemia and hyponatremia. Pt with hx of esophageal cancer s/p esophagectomy (total per wife) and laryngopharyngectomy on 6/22 with hx of chemo/radiation, G-tube placement, tonsil cancer, CHF, HTN, chronic pain  Discussed patient with RN today. Chart reviewed. Patient now with wound dehiscence with fistula at stoma, suspected anaerobic aspiration pneumonia, and hypothyroidism. Receiving aggressive thyroid replacement and TF via J-tube for malnutrition. Plans for transfer to [facility] in [place] or [facility] for further treatment. Received MD Consult for nocturnal TF management. Spoke with patient's wife. He has been tolerating nocturnal TF well at 42 ml/h. Will increase goal rate to 95 ml/h x 15 hours each night. Labs and medications reviewed. CBG's: [phone] Sodium 125 (L), serum osmolality 268 (L), urine osmolality 390 (WNL), urine sodium 73 (WNL) I/O -5.8 L since  admission Potassium, phosphorus, and magnesium are WNL  Diet Order:  NPO  Skin:  Reviewed, no issues  Last BM:  8/4  Height:   Ht Readings from Last 1 Encounters:  03/20/17 6\' 4"  (1.93 m)    Weight:   Wt Readings from Last 1 Encounters:  03/27/17 159 lb 13.3 oz (72.5 kg)    Ideal Body Weight:  91.8 kg  BMI:  Body mass index is 19.46 kg/m.  Estimated Nutritional Needs:   Kcal:  3361-2244 kcals  Protein:  110-130 g  Fluid:  >/= 2.2 L  EDUCATION NEEDS:   Education needs addressed  Molli Barrows, [place], [place], [place] Pager [phone] After Hours Pager [phone]

## 2017-04-18 ENCOUNTER — Emergency Department (HOSPITAL_COMMUNITY)
Admission: EM | Admit: 2017-04-18 | Discharge: 2017-04-19 | Disposition: A | Discharge status: Home / Self Care | Payer: Non-veteran care | Attending: Emergency Medicine | Admitting: Emergency Medicine

## 2017-04-18 ENCOUNTER — Encounter (HOSPITAL_COMMUNITY): Payer: Self-pay

## 2017-04-18 DIAGNOSIS — Z8501 Personal history of malignant neoplasm of esophagus: Secondary | ICD-10-CM | POA: Insufficient documentation

## 2017-04-18 DIAGNOSIS — Z79899 Other long term (current) drug therapy: Secondary | ICD-10-CM | POA: Diagnosis not present

## 2017-04-18 DIAGNOSIS — I110 Hypertensive heart disease with heart failure: Secondary | ICD-10-CM | POA: Insufficient documentation

## 2017-04-18 DIAGNOSIS — I509 Heart failure, unspecified: Secondary | ICD-10-CM | POA: Insufficient documentation

## 2017-04-18 DIAGNOSIS — Z85818 Personal history of malignant neoplasm of other sites of lip, oral cavity, and pharynx: Secondary | ICD-10-CM | POA: Insufficient documentation

## 2017-04-18 DIAGNOSIS — E039 Hypothyroidism, unspecified: Secondary | ICD-10-CM | POA: Diagnosis not present

## 2017-04-18 DIAGNOSIS — F329 Major depressive disorder, single episode, unspecified: Secondary | ICD-10-CM | POA: Insufficient documentation

## 2017-04-18 DIAGNOSIS — T85598A Other mechanical complication of other gastrointestinal prosthetic devices, implants and grafts, initial encounter: Secondary | ICD-10-CM | POA: Insufficient documentation

## 2017-04-18 DIAGNOSIS — N179 Acute kidney failure, unspecified: Secondary | ICD-10-CM | POA: Diagnosis not present

## 2017-04-18 DIAGNOSIS — Y829 Unspecified medical devices associated with adverse incidents: Secondary | ICD-10-CM | POA: Diagnosis not present

## 2017-04-18 DIAGNOSIS — Z87891 Personal history of nicotine dependence: Secondary | ICD-10-CM | POA: Insufficient documentation

## 2017-04-18 LAB — BASIC METABOLIC PANEL
ANION GAP: 12 (ref 5–15)
BUN: 37 mg/dL — AB (ref 6–20)
CALCIUM: 8.9 mg/dL (ref 8.9–10.3)
CO2: 30 mmol/L (ref 22–32)
Chloride: 96 mmol/L — ABNORMAL LOW (ref 101–111)
Creatinine, Ser: 1.82 mg/dL — ABNORMAL HIGH (ref 0.61–1.24)
GFR calc Af Amer: 40 mL/min — ABNORMAL LOW (ref >60)
GFR calc non Af Amer: 35 mL/min — ABNORMAL LOW (ref >60)
GLUCOSE: 88 mg/dL (ref 65–99)
Potassium: 4.1 mmol/L (ref 3.5–5.1)
Sodium: 138 mmol/L (ref 135–145)

## 2017-04-18 LAB — CBC
HEMATOCRIT: 26.7 % — AB (ref 39.0–52.0)
Hemoglobin: 8.5 g/dL — ABNORMAL LOW (ref 13.0–17.0)
MCH: 24.8 pg — AB (ref 26.0–34.0)
MCHC: 31.8 g/dL (ref 30.0–36.0)
MCV: 77.8 fL — AB (ref 78.0–100.0)
Platelets: 529 10*3/uL — ABNORMAL HIGH (ref 150–400)
RBC: 3.43 MIL/uL — ABNORMAL LOW (ref 4.22–5.81)
RDW: 19.5 % — AB (ref 11.5–15.5)
WBC: 9.2 10*3/uL (ref 4.0–10.5)

## 2017-04-18 MED ORDER — SODIUM CHLORIDE 0.9 % IV BOLUS (SEPSIS)
1000.0000 mL | Freq: Once | INTRAVENOUS | Status: AC
  Administered 2017-04-18: 1000 mL via INTRAVENOUS

## 2017-04-18 MED ORDER — PANCRELIPASE (LIP-PROT-AMYL) 36000-114000 UNITS PO CPEP
36000.0000 [IU] | ORAL_CAPSULE | Freq: Once | ORAL | Status: AC
  Administered 2017-04-18: 36000 [IU] via ORAL
  Filled 2017-04-18: qty 1

## 2017-04-18 MED ORDER — OXYCODONE HCL 5 MG PO TABS
5.0000 mg | ORAL_TABLET | Freq: Once | ORAL | Status: AC
  Administered 2017-04-19: 5 mg via ORAL
  Filled 2017-04-18: qty 1

## 2017-04-18 MED ORDER — SODIUM BICARBONATE 650 MG PO TABS
650.0000 mg | ORAL_TABLET | Freq: Once | ORAL | Status: AC
  Administered 2017-04-18: 650 mg via ORAL
  Filled 2017-04-18: qty 1

## 2017-04-18 MED ORDER — MORPHINE SULFATE (PF) 4 MG/ML IV SOLN
4.0000 mg | Freq: Once | INTRAVENOUS | Status: AC
  Administered 2017-04-18: 4 mg via INTRAVENOUS
  Filled 2017-04-18: qty 1

## 2017-04-18 NOTE — ED Triage Notes (Signed)
Pt presents for evaluation of feeding tube clogged. Pt wife noticed Sunday night that it was difficult to flush, seen at [facility] today and sent here. Pt with trach, reports no issues.

## 2017-04-19 ENCOUNTER — Emergency Department (HOSPITAL_COMMUNITY)
Admission: RE | Admit: 2017-04-19 | Discharge: 2017-04-19 | Disposition: A | Discharge status: Home / Self Care | Payer: Non-veteran care | Source: Ambulatory Visit | Attending: Emergency Medicine | Admitting: Emergency Medicine

## 2017-04-19 ENCOUNTER — Other Ambulatory Visit (HOSPITAL_COMMUNITY): Payer: Self-pay | Admitting: Emergency Medicine

## 2017-04-19 DIAGNOSIS — K9423 Gastrostomy malfunction: Secondary | ICD-10-CM

## 2017-04-19 LAB — I-STAT CHEM 8, ED
BUN: 39 mg/dL — AB (ref 6–20)
CALCIUM ION: 1.12 mmol/L — AB (ref 1.15–1.40)
CREATININE: 1.6 mg/dL — AB (ref 0.61–1.24)
Chloride: 95 mmol/L — ABNORMAL LOW (ref 101–111)
GLUCOSE: 81 mg/dL (ref 65–99)
HEMATOCRIT: 26 % — AB (ref 39.0–52.0)
HEMOGLOBIN: 8.8 g/dL — AB (ref 13.0–17.0)
Potassium: 4 mmol/L (ref 3.5–5.1)
Sodium: 136 mmol/L (ref 135–145)
TCO2: 33 mmol/L — AB (ref 22–32)

## 2017-04-19 NOTE — ED Provider Notes (Signed)
[place] DEPT Provider Note   CSN: [id number] Arrival date & time: 04/18/17  1446     History   Chief Complaint Chief Complaint  Patient presents with  . feeding tube clogged    HPI Adam Chambers. is a 76 y.o. male.  HPI   76 yo M with PMHx as below including esophageal CA s/p tracheotomy, with GJ tube in place, here with blockage of his GJ tube. Pt normally feeds throughout the day via tube feeds through his GJ tube. The pump reportedly broke 2 days ago and family has had to mix his food and pills manually, then inject them into his GJ tube. Approx 36 hours ago, family noticed difficulty with pushing his meds through the tube. They have been unable to push any meds, fluids, or feed through the tube since then. Pt is o/w strict NPO. He denies any complaints. Denies any abdominal pain. No change in his WOB or secretions. No trauma to the tube and sutures remain in place. It was last replaced in [place].  Past Medical History:  Diagnosis Date  . Allergic rhinitis   . Blood transfusion   . Blurred vision, left eye   . BPH (benign prostatic hypertrophy)   . CHF (congestive heart failure) ([place])   . Chronic back pain   . 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 Hepatitis 1995   food?  . History of chemotherapy 12/28/09 & then again 02/02/10 & 6/21/211   1 cycle cisplatin, then taxol & carboplatin  . Hx of radiation therapy 5/9/211-02/09/2010   rad tx tonsillar region   . Hypertension   . Hypothyroid 02/02/2012  . Hypothyroid 02/02/2012  . Otitis media    left eye  . Spinal stenosis    cervical and lumbar  . 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 Thyroid disease    secondary to radiation therapy  . 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 Wound dehiscence 03/26/2017  . Aspiration pneumonia of right lower lobe ([place])   . Dynamic ileus ([place])    . Current severe episode of major depressive disorder without psychotic features without prior episode ([place])   . Persistent postoperative fistula   . Benign prostatic hyperplasia 03/18/2017  . Hemorrhoids 03/18/2017  . Hypertension 03/18/2017  . Spinal stenosis 03/18/2017  . Hyponatremia 03/18/2017  . Altered mental status 03/17/2017  . Protein malnutrition ([place]) 08/26/2016  . Status post tracheostomy ([place]) 08/26/2016  . Esophageal cancer, stage IIA ([place]) 04/16/2014  . Xerostomia 01/30/2014  . Dysphagia 01/30/2014  . Elevated serum creatinine 01/30/2014  . Hypothyroidism 02/02/2012  . Chronic back pain 07/01/2011  . Tonsil cancer [facility])     Past Surgical History:  Procedure Laterality Date  . BACK SURGERY     1992  . CERVICAL FUSION    . ESOPHAGOGASTRODUODENOSCOPY  03/21/14  . ESOPHAGOGASTRODUODENOSCOPY N/A 05/05/2014   Procedure: ESOPHAGOGASTRODUODENOSCOPY (EGD);  Surgeon: Gatha Mayer, MD;  Location: [facility];  Service: Endoscopy;  Laterality: N/A;  . ESOPHAGOGASTRODUODENOSCOPY N/A 07/02/2014   Procedure: ESOPHAGOGASTRODUODENOSCOPY (EGD);  Surgeon: Gatha Mayer, MD;  Location: [facility];  Service: Endoscopy;  Laterality: N/A;  Clips, Nasobiliary Drain, Nasogastric Tube  . GASTROSTOMY TUBE PLACEMENT     for nutrition /hydration , s/p mucositis assoc with odynophagia&dysphasia  . REFRACTIVE SURGERY     left eye  .  TONSILLECTOMY         Home Medications    Prior to Admission medications   Medication Sig Start Date End Date Taking? Authorizing Provider  amLODipine (NORVASC) 5 MG tablet Take 5 mg by mouth daily.    Yes [provider]  benazepril (LOTENSIN) 20 MG tablet Take 20 mg by mouth daily.   Yes [provider]  diazepam (VALIUM) 5 MG tablet Take 5 mg by mouth every 6 (six) hours as needed for anxiety.    Yes [provider]  flunisolide (NASAREL) 29 MCG/ACT (0.025%) nasal spray Place 2 sprays into the nose daily as needed. Dose  is for each nostril.    Yes [provider]  levothyroxine (SYNTHROID, LEVOTHROID) 100 MCG tablet Take 100 mcg by mouth daily before breakfast.   Yes [provider]  metoprolol (TOPROL-XL) 200 MG 24 hr tablet Take 200 mg by mouth daily.    Yes [provider]  Nutritional Supplements (FEEDING SUPPLEMENT, VITAL 1.5 CAL,) LIQD Place 1,000 mLs into feeding tube daily. 03/24/17  Yes Nedrud, Larena Glassman, MD  oxyCODONE (ROXICODONE) 5 MG/5ML solution Take 5 mLs (5 mg total) by mouth every 4 (four) hours as needed for severe pain. 06/02/14  Yes Gery Pray, MD  traZODone (DESYREL) 50 MG tablet Take 50 mg by mouth at bedtime as needed for sleep.    Yes [provider]  venlafaxine (EFFEXOR) 37.5 MG tablet Place 1 tablet (37.5 mg total) into feeding tube 2 (two) times daily with a meal. Patient not taking: Reported on 04/18/2017 03/24/17   Thomasene Ripple, MD  Water For Irrigation, Sterile (FREE WATER) SOLN Place 240 mLs into feeding tube 4 (four) times daily. 03/24/17   Thomasene Ripple, MD    Family History Family History  Problem Relation Age of Onset  . Lung cancer Sister   . Diabetes Brother     Social History Social History  Substance Use Topics  . Smoking status: Former Smoker    Packs/day: 2.00    Years: 34.00    Types: Cigarettes    Quit date: 06/30/1993  . Smokeless tobacco: Never Used  . Alcohol use No     Allergies   Simvastatin   Review of Systems Review of Systems  Constitutional: Negative for chills, fatigue and fever.  HENT: Negative for congestion and rhinorrhea.   Eyes: Negative for visual disturbance.  Respiratory: Negative for cough, shortness of breath and wheezing.   Cardiovascular: Negative for chest pain and leg swelling.  Gastrointestinal: Negative for abdominal pain, diarrhea, nausea and vomiting.  Genitourinary: Negative for dysuria and flank pain.  Musculoskeletal: Negative for neck pain and neck stiffness.  Skin: Negative  for rash and wound.  Allergic/Immunologic: Negative for immunocompromised state.  Neurological: Negative for syncope, weakness and headaches.  All other systems reviewed and are negative.    Physical Exam Updated Vital Signs BP 140/88   Pulse 70   Temp 99 F (37.2 C) (Oral)   Resp 17   SpO2 100%   Physical Exam  Constitutional: He is oriented to person, place, and time. He appears well-developed and well-nourished. No distress.  HENT:  Head: Normocephalic and atraumatic.  Mildly dry MM  Eyes: Conjunctivae are normal.  Neck: Neck supple.  Trach in place lower neck. No drainage. No erythema.  Cardiovascular: Normal rate, regular rhythm and normal heart sounds.  Exam reveals no friction rub.   No murmur heard. Pulmonary/Chest: Effort normal and breath sounds normal. No respiratory distress. He has no  wheezes. He has no rales.  Mild transmitted upper airway sounds from trach  Abdominal: Soft. Bowel sounds are normal. He exhibits no distension.  GJ tube in place, sutures in place. No apparent trauma to the external tubing. Mild skin breakdown at the site, but no induration or fluctuance. Abdomen is o/w soft.  Musculoskeletal: He exhibits no edema.  Neurological: He is alert and oriented to person, place, and time. He exhibits normal muscle tone.  Skin: Skin is warm. Capillary refill takes less than 2 seconds.  Psychiatric: He has a normal mood and affect.  Nursing note and vitals reviewed.    ED Treatments / Results  Labs (all labs ordered are listed, but only abnormal results are displayed) Labs Reviewed  CBC - Abnormal; Notable for the following:       Result Value   RBC 3.43 (*)    Hemoglobin 8.5 (*)    HCT 26.7 (*)    MCV 77.8 (*)    MCH 24.8 (*)    RDW 19.5 (*)    Platelets 529 (*)    All other components within normal limits  BASIC METABOLIC PANEL - Abnormal; Notable for the following:    Chloride 96 (*)    BUN 37 (*)    Creatinine, Ser 1.82 (*)    GFR calc  non Af Amer 35 (*)    GFR calc Af Amer 40 (*)    All other components within normal limits  I-STAT CHEM 8, ED - Abnormal; Notable for the following:    Chloride 95 (*)    BUN 39 (*)    Creatinine, Ser 1.60 (*)    Calcium, Ion 1.12 (*)    TCO2 33 (*)    Hemoglobin 8.8 (*)    HCT 26.0 (*)    All other components within normal limits    EKG  EKG Interpretation None       Radiology No results found.  Procedures Procedures (including critical care time)  Medications Ordered in ED Medications  lipase/protease/amylase (CREON) capsule 36,000 Units (36,000 Units Oral Given 04/18/17 2231)    And  sodium bicarbonate tablet 650 mg (650 mg Oral Given 04/18/17 2231)  sodium chloride 0.9 % bolus 1,000 mL (0 mLs Intravenous Stopped 04/18/17 2218)  morphine 4 MG/ML injection 4 mg (4 mg Intravenous Given 04/18/17 2319)  oxyCODONE (Oxy IR/ROXICODONE) immediate release tablet 5 mg (5 mg Oral Given 04/19/17 0007)     Initial Impression / Assessment and Plan / ED Course  I have reviewed the triage vital signs and the nursing notes.  Pertinent labs & imaging results that were available during my care of the patient were reviewed by me and considered in my medical decision making (see chart for details).     76 yo M here with blockage of his feeding tube after his home pump malfunctioned. On arrival, creon/bicarb instilled into GJ tube with resolution of blockage. The [place] tube can now be freely, easily flushed without pain, and there is no apparent trauma to the tubing. Stay sutures remain in place. I suspect the blockage was 2/2 receiving his meds, possibly more concentrated feeds in setting of his pump malfunctioning.   Of note, pt has not had meds/feeds for >24 hours. He does appear to have a mild AKI. He was given IVF here and Cr downtrending in the ED. I offered admission for pt to monitor his tubing, provide fluids but pt, wife would like to return home. He is o/w well, and based  on shared  decision making, feel this is reasonable. I will have him resume his feeds, hold his ACE, and call his nutritionist in the AM to discuss adjusting his feeds, as well as starting free water in the setting of his AKI. PCP f/u discussed.  Final Clinical Impressions(s) / ED Diagnoses   Final diagnoses:  Obstruction of feeding tube, initial encounter  AKI (acute kidney injury) [facility])    New Prescriptions Discharge Medication List as of 04/19/2017  1:01 AM       Duffy Bruce, MD 04/19/17 [phone]

## 2017-04-19 NOTE — Discharge Instructions (Signed)
Call your nutritionist to discuss your ER visit and to see how much "free water" you should be using.  STOP taking your Benazepril (Lotensin) for the next week. Follow-up with your doctor in 3-5 days for repeat labs.  Return to the ER if your feeding tube does not work.

## 2017-04-20 ENCOUNTER — Other Ambulatory Visit (HOSPITAL_COMMUNITY): Payer: Self-pay | Admitting: Emergency Medicine

## 2017-04-20 DIAGNOSIS — R633 Feeding difficulties, unspecified: Secondary | ICD-10-CM

## 2017-04-21 ENCOUNTER — Ambulatory Visit (HOSPITAL_COMMUNITY)
Admission: RE | Admit: 2017-04-21 | Discharge: 2017-04-21 | Disposition: A | Discharge status: Home / Self Care | Payer: Non-veteran care | Source: Ambulatory Visit | Attending: Emergency Medicine | Admitting: Emergency Medicine

## 2017-04-21 ENCOUNTER — Encounter (HOSPITAL_COMMUNITY): Payer: Self-pay | Admitting: Interventional Radiology

## 2017-04-21 DIAGNOSIS — Y733 Surgical instruments, materials and gastroenterology and urology devices (including sutures) associated with adverse incidents: Secondary | ICD-10-CM | POA: Insufficient documentation

## 2017-04-21 DIAGNOSIS — R633 Feeding difficulties, unspecified: Secondary | ICD-10-CM

## 2017-04-21 DIAGNOSIS — C159 Malignant neoplasm of esophagus, unspecified: Secondary | ICD-10-CM | POA: Diagnosis not present

## 2017-04-21 DIAGNOSIS — K9413 Enterostomy malfunction: Secondary | ICD-10-CM | POA: Diagnosis not present

## 2017-04-21 HISTORY — PX: IR REPLC DUODEN/JEJUNO TUBE PERCUT W/FLUORO: IMG2334

## 2017-04-21 MED ORDER — LIDOCAINE HCL (PF) 1 % IJ SOLN
INTRAMUSCULAR | Status: AC
  Filled 2017-04-21: qty 30

## 2017-04-21 MED ORDER — IOPAMIDOL (ISOVUE-300) INJECTION 61%
INTRAVENOUS | Status: AC
  Administered 2017-04-21: 15 mL
  Filled 2017-04-21: qty 50

## 2017-04-21 MED ORDER — LIDOCAINE VISCOUS 2 % MT SOLN
OROMUCOSAL | Status: AC
  Filled 2017-04-21: qty 15

## 2017-04-21 NOTE — Procedures (Signed)
  Procedure:   J-tube exchange to 64F Preprocedure diagnosis:  Tube malfunction Postprocedure diagnosis:  same EBL:     minimal Complications:   none immediate  See full dictation in [employer].  Dillard Cannon MD Main # [phone] Pager  [phone]

## 2017-05-04 ENCOUNTER — Encounter (HOSPITAL_BASED_OUTPATIENT_CLINIC_OR_DEPARTMENT_OTHER): Payer: Non-veteran care | Attending: Internal Medicine

## 2017-05-04 ENCOUNTER — Other Ambulatory Visit (HOSPITAL_COMMUNITY)
Admission: RE | Admit: 2017-05-04 | Discharge: 2017-05-04 | Disposition: A | Discharge status: Home / Self Care | Payer: Non-veteran care | Source: Ambulatory Visit | Attending: Internal Medicine | Admitting: Internal Medicine

## 2017-05-04 DIAGNOSIS — E860 Dehydration: Secondary | ICD-10-CM | POA: Insufficient documentation

## 2017-05-04 DIAGNOSIS — Z923 Personal history of irradiation: Secondary | ICD-10-CM | POA: Insufficient documentation

## 2017-05-04 DIAGNOSIS — Y838 Other surgical procedures as the cause of abnormal reaction of the patient, or of later complication, without mention of misadventure at the time of the procedure: Secondary | ICD-10-CM | POA: Insufficient documentation

## 2017-05-04 DIAGNOSIS — Z8589 Personal history of malignant neoplasm of other organs and systems: Secondary | ICD-10-CM | POA: Diagnosis not present

## 2017-05-04 DIAGNOSIS — E039 Hypothyroidism, unspecified: Secondary | ICD-10-CM | POA: Insufficient documentation

## 2017-05-04 DIAGNOSIS — J9620 Acute and chronic respiratory failure, unspecified whether with hypoxia or hypercapnia: Secondary | ICD-10-CM | POA: Diagnosis not present

## 2017-05-04 DIAGNOSIS — R531 Weakness: Secondary | ICD-10-CM | POA: Diagnosis present

## 2017-05-04 DIAGNOSIS — Z87891 Personal history of nicotine dependence: Secondary | ICD-10-CM | POA: Insufficient documentation

## 2017-05-04 DIAGNOSIS — J9509 Other tracheostomy complication: Secondary | ICD-10-CM | POA: Diagnosis not present

## 2017-05-04 DIAGNOSIS — E43 Unspecified severe protein-calorie malnutrition: Secondary | ICD-10-CM | POA: Insufficient documentation

## 2017-05-04 DIAGNOSIS — L89150 Pressure ulcer of sacral region, unstageable: Secondary | ICD-10-CM | POA: Insufficient documentation

## 2017-05-04 DIAGNOSIS — Z8501 Personal history of malignant neoplasm of esophagus: Secondary | ICD-10-CM | POA: Insufficient documentation

## 2017-05-04 DIAGNOSIS — L598 Other specified disorders of the skin and subcutaneous tissue related to radiation: Secondary | ICD-10-CM | POA: Insufficient documentation

## 2017-05-04 LAB — CBC WITH DIFFERENTIAL/PLATELET
BASOS PCT: 0 %
Basophils Absolute: 0 10*3/uL (ref 0.0–0.1)
EOS ABS: 0 10*3/uL (ref 0.0–0.7)
Eosinophils Relative: 0 %
HCT: 32 % — ABNORMAL LOW (ref 39.0–52.0)
HEMOGLOBIN: 10.2 g/dL — AB (ref 13.0–17.0)
Lymphocytes Relative: 15 %
Lymphs Abs: 1.1 10*3/uL (ref 0.7–4.0)
MCH: 25.3 pg — ABNORMAL LOW (ref 26.0–34.0)
MCHC: 31.9 g/dL (ref 30.0–36.0)
MCV: 79.4 fL (ref 78.0–100.0)
MONO ABS: 0.5 10*3/uL (ref 0.1–1.0)
MONOS PCT: 7 %
NEUTROS PCT: 78 %
Neutro Abs: 5.6 10*3/uL (ref 1.7–7.7)
Platelets: 514 10*3/uL — ABNORMAL HIGH (ref 150–400)
RBC: 4.03 MIL/uL — ABNORMAL LOW (ref 4.22–5.81)
RDW: 19.6 % — AB (ref 11.5–15.5)
WBC: 7.2 10*3/uL (ref 4.0–10.5)

## 2017-05-04 LAB — COMPREHENSIVE METABOLIC PANEL
ALBUMIN: 2.9 g/dL — AB (ref 3.5–5.0)
ALK PHOS: 84 U/L (ref 38–126)
ALT: 12 U/L — ABNORMAL LOW (ref 17–63)
ANION GAP: 11 (ref 5–15)
AST: 14 U/L — ABNORMAL LOW (ref 15–41)
BILIRUBIN TOTAL: 0.7 mg/dL (ref 0.3–1.2)
BUN: 56 mg/dL — ABNORMAL HIGH (ref 6–20)
CALCIUM: 9.5 mg/dL (ref 8.9–10.3)
CO2: 28 mmol/L (ref 22–32)
Chloride: 103 mmol/L (ref 101–111)
Creatinine, Ser: 1.51 mg/dL — ABNORMAL HIGH (ref 0.61–1.24)
GFR, EST AFRICAN AMERICAN: 50 mL/min — AB (ref >60)
GFR, EST NON AFRICAN AMERICAN: 43 mL/min — AB (ref >60)
Glucose, Bld: 103 mg/dL — ABNORMAL HIGH (ref 65–99)
POTASSIUM: 4.4 mmol/L (ref 3.5–5.1)
Sodium: 142 mmol/L (ref 135–145)
TOTAL PROTEIN: 7.7 g/dL (ref 6.5–8.1)

## 2017-05-04 LAB — TSH: TSH: 130.314 u[IU]/mL — AB (ref 0.350–4.500)

## 2017-05-05 ENCOUNTER — Encounter (HOSPITAL_COMMUNITY): Payer: Self-pay | Admitting: Emergency Medicine

## 2017-05-05 ENCOUNTER — Emergency Department (HOSPITAL_COMMUNITY): Payer: Non-veteran care

## 2017-05-05 ENCOUNTER — Emergency Department (HOSPITAL_COMMUNITY)
Admission: EM | Admit: 2017-05-05 | Discharge: 2017-05-05 | Disposition: A | Discharge status: Home / Self Care | Payer: Non-veteran care | Attending: Emergency Medicine | Admitting: Emergency Medicine

## 2017-05-05 DIAGNOSIS — I509 Heart failure, unspecified: Secondary | ICD-10-CM | POA: Insufficient documentation

## 2017-05-05 DIAGNOSIS — Z85818 Personal history of malignant neoplasm of other sites of lip, oral cavity, and pharynx: Secondary | ICD-10-CM | POA: Insufficient documentation

## 2017-05-05 DIAGNOSIS — E039 Hypothyroidism, unspecified: Secondary | ICD-10-CM | POA: Insufficient documentation

## 2017-05-05 DIAGNOSIS — Z79899 Other long term (current) drug therapy: Secondary | ICD-10-CM | POA: Diagnosis not present

## 2017-05-05 DIAGNOSIS — Z8501 Personal history of malignant neoplasm of esophagus: Secondary | ICD-10-CM | POA: Diagnosis not present

## 2017-05-05 DIAGNOSIS — E860 Dehydration: Secondary | ICD-10-CM | POA: Insufficient documentation

## 2017-05-05 DIAGNOSIS — I110 Hypertensive heart disease with heart failure: Secondary | ICD-10-CM | POA: Diagnosis not present

## 2017-05-05 DIAGNOSIS — Z87891 Personal history of nicotine dependence: Secondary | ICD-10-CM | POA: Insufficient documentation

## 2017-05-05 DIAGNOSIS — R531 Weakness: Secondary | ICD-10-CM | POA: Diagnosis present

## 2017-05-05 LAB — CBC
HCT: 33.7 % — ABNORMAL LOW (ref 39.0–52.0)
HEMOGLOBIN: 10.3 g/dL — AB (ref 13.0–17.0)
MCH: 24.6 pg — ABNORMAL LOW (ref 26.0–34.0)
MCHC: 30.6 g/dL (ref 30.0–36.0)
MCV: 80.6 fL (ref 78.0–100.0)
Platelets: 447 10*3/uL — ABNORMAL HIGH (ref 150–400)
RBC: 4.18 MIL/uL — ABNORMAL LOW (ref 4.22–5.81)
RDW: 19.7 % — AB (ref 11.5–15.5)
WBC: 7.5 10*3/uL (ref 4.0–10.5)

## 2017-05-05 LAB — BASIC METABOLIC PANEL
ANION GAP: 8 (ref 5–15)
BUN: 61 mg/dL — AB (ref 6–20)
CALCIUM: 9.3 mg/dL (ref 8.9–10.3)
CO2: 29 mmol/L (ref 22–32)
Chloride: 106 mmol/L (ref 101–111)
Creatinine, Ser: 1.53 mg/dL — ABNORMAL HIGH (ref 0.61–1.24)
GFR calc Af Amer: 50 mL/min — ABNORMAL LOW (ref >60)
GFR, EST NON AFRICAN AMERICAN: 43 mL/min — AB (ref >60)
GLUCOSE: 100 mg/dL — AB (ref 65–99)
POTASSIUM: 4.6 mmol/L (ref 3.5–5.1)
SODIUM: 143 mmol/L (ref 135–145)

## 2017-05-05 LAB — URINALYSIS, ROUTINE W REFLEX MICROSCOPIC
Bilirubin Urine: NEGATIVE
Glucose, UA: NEGATIVE mg/dL
Hgb urine dipstick: NEGATIVE
Ketones, ur: NEGATIVE mg/dL
LEUKOCYTES UA: NEGATIVE
NITRITE: NEGATIVE
PH: 5 (ref 5.0–8.0)
Protein, ur: NEGATIVE mg/dL
Specific Gravity, Urine: 1.02 (ref 1.005–1.030)

## 2017-05-05 LAB — CBG MONITORING, ED: GLUCOSE-CAPILLARY: 89 mg/dL (ref 65–99)

## 2017-05-05 MED ORDER — SODIUM CHLORIDE 0.9 % IV SOLN
INTRAVENOUS | Status: DC
Start: 2017-05-05 — End: 2017-05-06
  Administered 2017-05-05: 16:00:00 via INTRAVENOUS

## 2017-05-05 MED ORDER — SODIUM CHLORIDE 0.9 % IV BOLUS (SEPSIS)
1000.0000 mL | Freq: Once | INTRAVENOUS | Status: AC
  Administered 2017-05-05: 1000 mL via INTRAVENOUS

## 2017-05-05 MED ORDER — DIATRIZOATE MEGLUMINE & SODIUM 66-10 % PO SOLN
90.0000 mL | Freq: Once | ORAL | Status: AC
  Administered 2017-05-05: 90 mL via JEJUNOSTOMY
  Filled 2017-05-05: qty 90

## 2017-05-05 NOTE — ED Notes (Signed)
Pt is resting-wife at  Bedside.  She wants to suction pt's trach, she needed assistance with suction tubing.  Offered her help, pt declined

## 2017-05-05 NOTE — ED Notes (Signed)
Adam Chambers EDP notified pt's c/o pain and pt's wife requesting for the EDP to evaluate his pressure ulcer wound.  Wife made aware

## 2017-05-05 NOTE — ED Provider Notes (Signed)
[place] DEPT Provider Note   CSN: [id number] Arrival date & time: 05/05/17  1350     History   Chief Complaint Chief Complaint  Patient presents with  . Weakness    HPI Adam Chambers. is a 76 y.o. male.  He is here because he is on able to tolerate his normal dosage of jejunal feedings, because of discomfort on the infusion.  Is currently using infusion pump, but in the last several days has not been able to tolerate the usual amount prescribed.  There is been no nausea, vomiting, fever or diarrhea.  There is been no noted bleeding from the rectum.  He was in the ED recently with a clogged jejunal tube, which was mobilized, by injecting Creon with bicarbonate.  He has recently been diagnosed with hypothyroidism.  He has been started on Synthroid for that.  He was recently hospitalized had a procedure to remove the lower esophageal tumor, and apparently has been reconnected.  He has a tracheostomy, but is not using it.  His wife is concerned that the tracheostomy is congested.  There are no other known modifying factors.  HPI  Past Medical History:  Diagnosis Date  . Allergic rhinitis   . Blood transfusion   . Blurred vision, left eye   . BPH (benign prostatic hypertrophy)   . CHF (congestive heart failure) ([place])   . Chronic back pain   . Esophageal cancer ([place]) 03/2014   invasive squamous cell  . Hepatitis 1995   food?  . History of chemotherapy 12/28/09 & then again 02/02/10 & 6/21/211   1 cycle cisplatin, then taxol & carboplatin  . Hx of radiation therapy 5/9/211-02/09/2010   rad tx tonsillar region   . Hypertension   . Hypothyroid 02/02/2012  . Hypothyroid 02/02/2012  . Otitis media    left eye  . Spinal stenosis    cervical and lumbar  . 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 Thyroid disease    secondary to radiation therapy  . 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 Wound dehiscence 03/26/2017  . Aspiration pneumonia of right lower lobe ([place])   . Dynamic ileus ([place])   . Current severe episode of major depressive disorder without psychotic features without prior episode ([place])   . Persistent postoperative fistula   . Benign prostatic hyperplasia 03/18/2017  . Hemorrhoids 03/18/2017  . Hypertension 03/18/2017  . Spinal stenosis 03/18/2017  . Hyponatremia 03/18/2017  . Altered mental status 03/17/2017  . Protein malnutrition ([place]) 08/26/2016  . Status post tracheostomy ([place]) 08/26/2016  . Esophageal cancer, stage IIA ([place]) 04/16/2014  . Xerostomia 01/30/2014  . Dysphagia 01/30/2014  . Elevated serum creatinine 01/30/2014  . Hypothyroidism 02/02/2012  . Chronic back pain 07/01/2011  . Tonsil cancer [facility])     Past Surgical History:  Procedure Laterality Date  . BACK SURGERY     1992  . CERVICAL FUSION    . ESOPHAGOGASTRODUODENOSCOPY  03/21/14  . ESOPHAGOGASTRODUODENOSCOPY N/A 05/05/2014   Procedure: ESOPHAGOGASTRODUODENOSCOPY (EGD);  Surgeon: Gatha Mayer, MD;  Location: [facility];  Service: Endoscopy;  Laterality: N/A;  . ESOPHAGOGASTRODUODENOSCOPY N/A 07/02/2014   Procedure: ESOPHAGOGASTRODUODENOSCOPY (EGD);  Surgeon: Gatha Mayer, MD;  Location: [facility];  Service: Endoscopy;  Laterality: N/A;  Clips, Nasobiliary Drain, Nasogastric Tube  . GASTROSTOMY TUBE PLACEMENT     for nutrition /hydration ,  s/p mucositis assoc with odynophagia&dysphasia  . IR REPLC DUODEN/JEJUNO TUBE PERCUT W/FLUORO  04/21/2017  . REFRACTIVE SURGERY     left eye  . TONSILLECTOMY         Home Medications    Prior to Admission medications   Medication Sig Start Date End Date Taking? Authorizing Provider  amLODipine (NORVASC) 5 MG tablet Take 5 mg by mouth daily.    Yes [provider]  benazepril (LOTENSIN) 20 MG tablet Take 20 mg by mouth daily.   Yes [provider]  levothyroxine (SYNTHROID, LEVOTHROID) 100  MCG tablet Take 100 mcg by mouth daily before breakfast.   Yes [provider]  metoprolol (TOPROL-XL) 200 MG 24 hr tablet Take 200 mg by mouth daily.    Yes [provider]  Nutritional Supplements (FEEDING SUPPLEMENT, VITAL 1.5 CAL,) LIQD Place 1,000 mLs into feeding tube daily. 03/24/17  Yes Nedrud, Larena Glassman, MD  oxyCODONE (ROXICODONE) 5 MG/5ML solution Take 5 mLs (5 mg total) by mouth every 4 (four) hours as needed for severe pain. 06/02/14  Yes Gery Pray, MD  Probiotic Product (PROBIOTIC PO) Take 2 tablets by mouth 2 (two) times daily.   Yes [provider]  traZODone (DESYREL) 50 MG tablet Take 50 mg by mouth at bedtime as needed for sleep.    Yes [provider]  Water For Irrigation, Sterile (FREE WATER) SOLN Place 240 mLs into feeding tube 4 (four) times daily. 03/24/17  Yes Nedrud, Larena Glassman, MD  diazepam (VALIUM) 5 MG tablet Take 5 mg by mouth every 6 (six) hours as needed for anxiety.     [provider]  flunisolide (NASAREL) 29 MCG/ACT (0.025%) nasal spray Place 2 sprays into the nose daily as needed. Dose is for each nostril.     [provider]  venlafaxine (EFFEXOR) 37.5 MG tablet Place 1 tablet (37.5 mg total) into feeding tube 2 (two) times daily with a meal. Patient not taking: Reported on 04/18/2017 03/24/17   Thomasene Ripple, MD    Family History Family History  Problem Relation Age of Onset  . Lung cancer Sister   . Diabetes Brother     Social History Social History  Substance Use Topics  . Smoking status: Former Smoker    Packs/day: 2.00    Years: 34.00    Types: Cigarettes    Quit date: 06/30/1993  . Smokeless tobacco: Never Used  . Alcohol use No     Allergies   Simvastatin   Review of Systems Review of Systems  All other systems reviewed and are negative.    Physical Exam Updated Vital Signs BP 126/84   Pulse (!) 56   Temp 98.2 F (36.8 C) (Oral)   Resp 15   SpO2 100%   Physical Exam    Constitutional: He is oriented to person, place, and time. He appears well-developed. No distress.  Frail, elderly.  HENT:  Head: Normocephalic and atraumatic.  Right Ear: External ear normal.  Left Ear: External ear normal.  Eyes: Pupils are equal, round, and reactive to light. Conjunctivae and EOM are normal.  Neck: Normal range of motion and phonation normal. Neck supple.  Cardiovascular: Normal rate, regular rhythm and normal heart sounds.   Pulmonary/Chest: Effort normal and breath sounds normal. He exhibits no bony tenderness.  Abdominal: Soft. He exhibits no distension. There is no tenderness. There is no guarding.  Jejunostomy tube, dual-lumen, left mid abdomen.  Site and appliance appear normal.  Musculoskeletal: Normal range of motion.  Neurological: He is alert and oriented to person, place, and time. No cranial nerve deficit or sensory deficit. He exhibits normal muscle tone. Coordination normal.  Skin: Skin is warm, dry and intact.  Psychiatric: He has a normal mood and affect. His behavior is normal. Judgment and thought content normal.  Nursing note and vitals reviewed.    ED Treatments / Results  Labs (all labs ordered are listed, but only abnormal results are displayed) Labs Reviewed  BASIC METABOLIC PANEL - Abnormal; Notable for the following:       Result Value   Glucose, Bld 100 (*)    BUN 61 (*)    Creatinine, Ser 1.53 (*)    GFR calc non Af Amer 43 (*)    GFR calc Af Amer 50 (*)    All other components within normal limits  CBC - Abnormal; Notable for the following:    RBC 4.18 (*)    Hemoglobin 10.3 (*)    HCT 33.7 (*)    MCH 24.6 (*)    RDW 19.7 (*)    Platelets 447 (*)    All other components within normal limits  URINALYSIS, ROUTINE W REFLEX MICROSCOPIC  CBG MONITORING, ED    EKG  EKG Interpretation  Date/Time:  Friday May 05 2017 14:22:54 EDT Ventricular Rate:  68 PR Interval:    QRS Duration: 93 QT Interval:  415 QTC  Calculation: 442 R Axis:   -39 Text Interpretation:  Sinus rhythm Left ventricular hypertrophy Anterior infarct, old since last tracing no significant change Confirmed by Daleen Bo [phone]) on 05/05/2017 3:05:25 PM       Radiology Dg Abdomen 1 View  Result Date: 05/05/2017 CLINICAL DATA:  Esophageal cancer.  Jejunostomy tube assessment. EXAM: ABDOMEN - 1 VIEW COMPARISON:  05/05/2017 radiographs FINDINGS: Contrast medium is present in distal small bowel loops and in the proximal half of the colon. This would seem to indicate patency. The jejunostomy tube appears to contain contrast and gas. The tip of the jejunostomy catheter is now in the right abdomen, likely from some shifting of bowel. Bandlike opacity peripherally at the right lung base similar to prior. Probably from scarring. Postoperative findings at L5 with posterolateral rod and pedicle screw fixation at L5-S1. IMPRESSION: 1. Contrast medium is in the distal small bowel and proximal colon. This would seem to indicate patency of the tube. The tip of the jejunostomy tube is now on the right abdomen, likely from some shifting of bowel. The tube currently contains contrast medium and a gas bubble, I would suggest flushing with saline. 2. Scarring at the right lung base. Electronically Signed   By: Van Clines M.D.   On: 05/05/2017 20:16   Dg Abdomen 1 View  Result Date: 05/05/2017 CLINICAL DATA:  76 year old male with percutaneous jejunostomy tube in place EXAM: ABDOMEN - 1 VIEW COMPARISON:  Reason tube up size and exchange 04/21/2017 ; prior chest x-ray 03/25/2017 FINDINGS: The bowel gas pattern is normal. The tip of the percutaneous jejunostomy tube overlies the left lower quadrant in unchanged position compared to the recent replacement radiographs. Nonspecific nodular opacity in the periphery of the right lung base may represent infiltrate, scarring or a pulmonary nodule. Nodule in is in a region of infiltrate on the recent prior  chest x-ray from 03/25/2017. No acute osseous abnormality. Prior posterior lumbar interbody fusion at L5-S1. IMPRESSION: 1. The tip of the percutaneous jejunostomy tube is in unchanged position compared to the recent replacement radiographs from 04/21/2017. 2. Clearing  of patchy airspace opacity from the right lung base compared 03/25/2017. However, there is a residual nodular opacity. Recommend further evaluation with non emergent dedicated PA and lateral chest x-ray. Electronically Signed   By: Jacqulynn Cadet M.D.   On: 05/05/2017 17:48    Procedures Procedures (including critical care time)  Medications Ordered in ED Medications  0.9 %  sodium chloride infusion ( Intravenous New Bag/Given 05/05/17 1530)  diatrizoate meglumine-sodium (GASTROGRAFIN) 66-10 % solution 90 mL (90 mLs Per J Tube Given 05/05/17 1724)  sodium chloride 0.9 % bolus 1,000 mL (0 mLs Intravenous Stopped 05/05/17 1834)     Initial Impression / Assessment and Plan / ED Course  I have reviewed the triage vital signs and the nursing notes.  Pertinent labs & imaging results that were available during my care of the patient were reviewed by me and considered in my medical decision making (see chart for details).      Patient Vitals for the past 24 hrs:  BP Temp Temp src Pulse Resp SpO2  05/05/17 1835 126/84 - - (!) 56 15 100 %  05/05/17 1800 127/80 - - - 20 -  05/05/17 1730 (!) 143/91 - - - 12 97 %  05/05/17 1700 (!) 141/91 - - - 20 -  05/05/17 1630 138/90 - - - (!) 22 93 %  05/05/17 1530 133/86 - - - 18 97 %  05/05/17 1412 122/90 98.2 F (36.8 C) Oral 65 18 100 %    9:49 PM Reevaluation with update and discussion. After initial assessment and treatment, an updated evaluation reveals no change in clinical status.  He remains alert, and responsive.  Findings discussed with patient, and wife, all questions answered. Rhydian Baldi L      Final Clinical Impressions(s) / ED Diagnoses   Final diagnoses:   Dehydration    Patient presenting with discomfort, while getting tube feedings.  Jejunal tube function appears normal, with administration of Gastrografin contrast.  There is no indication for obstruction, or feeding tube malfunction.  He is mildly dehydrated and has been treated with IV fluid bolus.  He has apparently been using bolus administrations, and the J-tube, and will do better with continuous tube feedings.   Nursing Notes Reviewed/ Care Coordinated Applicable Imaging Reviewed Interpretation of Laboratory Data incorporated into ED treatment  The patient appears reasonably screened and/or stabilized for discharge and I doubt any other medical condition or other [facility] requiring further screening, evaluation, or treatment in the ED at this time prior to discharge.  Plan: Home Medications-continue current medications; Home Treatments-changed to continuous tube feedings; return here if the recommended treatment, does not improve the symptoms; Recommended follow up-PCP, 1 week and as needed   New Prescriptions New Prescriptions   No medications on file     Daleen Bo, MD 05/05/17 2151

## 2017-05-05 NOTE — Discharge Instructions (Signed)
Use the machine to give your total daily dose, of intestinal feeding, continuously over 24 hours.  Follow-up with your primary care doctor, as scheduled.

## 2017-05-05 NOTE — ED Triage Notes (Signed)
Family reports pt has not been allowing them to feed him as much through his feeding tube. Went to [facility] yesterday, and labwork was drawn to evaluate his weakness. Family reports they got a phone call to bring him to the ED for low thyroid level. Pt has an established trach.

## 2017-05-05 NOTE — ED Notes (Signed)
Urinal is at bedside. Pt does not have to urinate at the moment.

## 2017-07-22 DEATH — deceased

## 2019-07-13 IMAGING — RF DG ESOPHAGUS
5 series · 14 of 14 positions shown · non-contrast
Comparison: None.

ADDENDUM:
Upon further review of the images and after telephone discussion
with Dr. Sibille, there is a focal outpouching of contrast best
appreciated on series 5 images 5-17, immediately anterior to the mid
to distal aspect of the patient's cervical spinal fusion hardware.
This persists after transit of the contrast bolus, and is clearly
extraluminal outside the confines of the cervical esophagus. This
corresponds to the reported false passage in this region on recent
flexible laryngoscopy, concerning for dehiscence at or near the of
the anastomosis for the gastric pull-through.
CLINICAL DATA: 76-year-old male with history of increasing
oropharyngeal secretions. Patient is status post laryngectomy,
pharyngectomy an esophagectomy with gastric pull-through.

EXAM:
ESOPHOGRAM/BARIUM SWALLOW
TECHNIQUE: Single contrast examination was performed using water soluble
contrast (10 mL of [id number]).
FLUOROSCOPY TIME:  Fluoroscopy Time:  1 minutes and 12 seconds
Radiation Exposure Index (if provided by the fluoroscopic device): 4
mGy

[Series 1: fluoro_barium 2fps_bw · 0.17mm/px · 1 of 1 slices shown (1 of 2)]
[im 1/1]
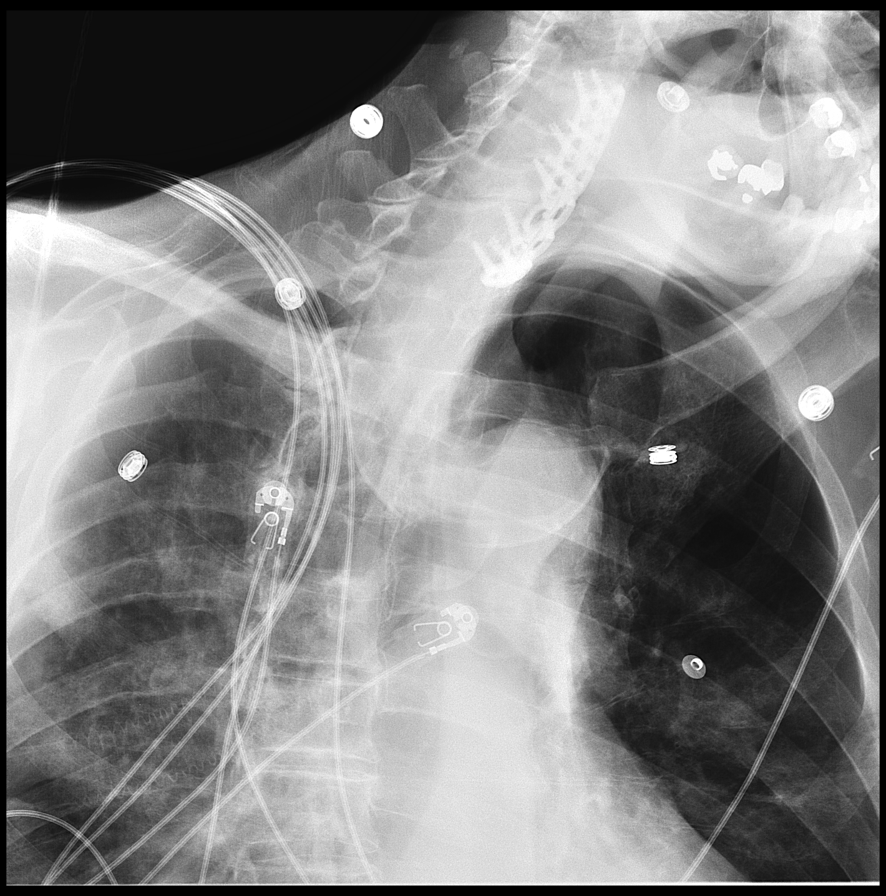

[Series 2: cp_standard · 0.51mm/px · 4 of 56 frames shown (1 of 3)]
[frame 9/56]
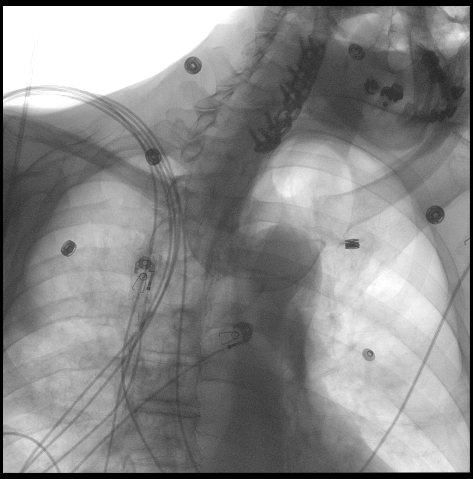
[frame 29/56]
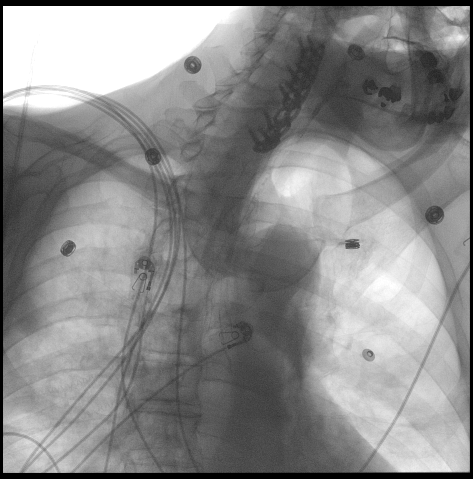
[frame 48/56]
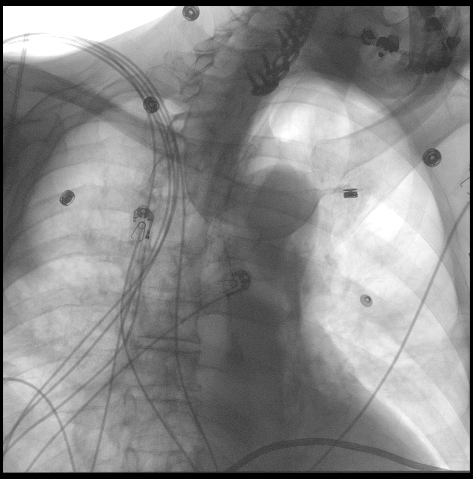
[frame 53/56]
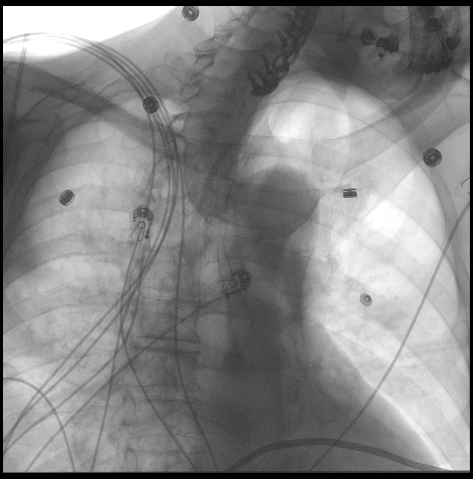

[Series 3: cp_standard · 0.51mm/px · 4 of 42 frames shown (2 of 3)]
[frame 7/42]
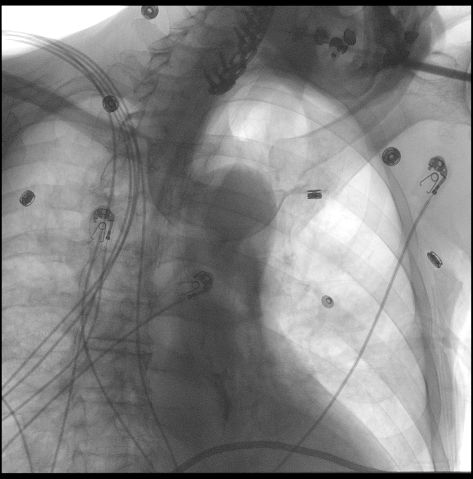
[frame 22/42]
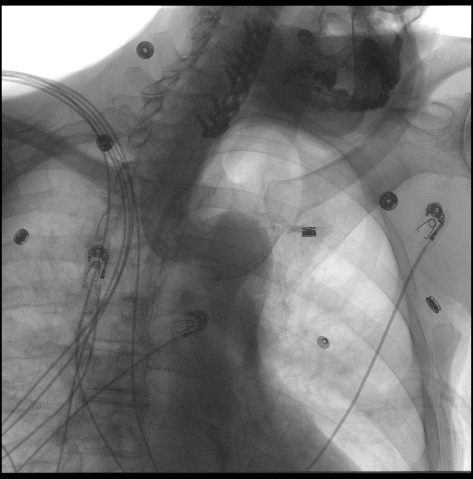
[frame 31/42]
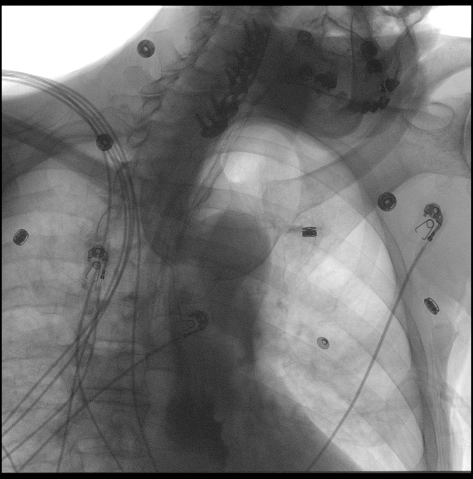
[frame 36/42]
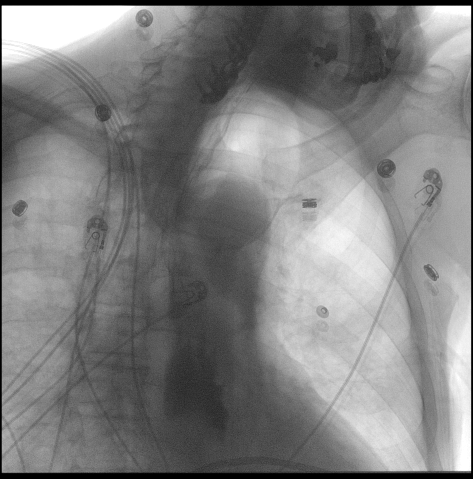

[Series 4: fluoro_barium 2fps_bw · 0.17mm/px · 1 of 1 slices shown (2 of 2)]
[im 1/1]
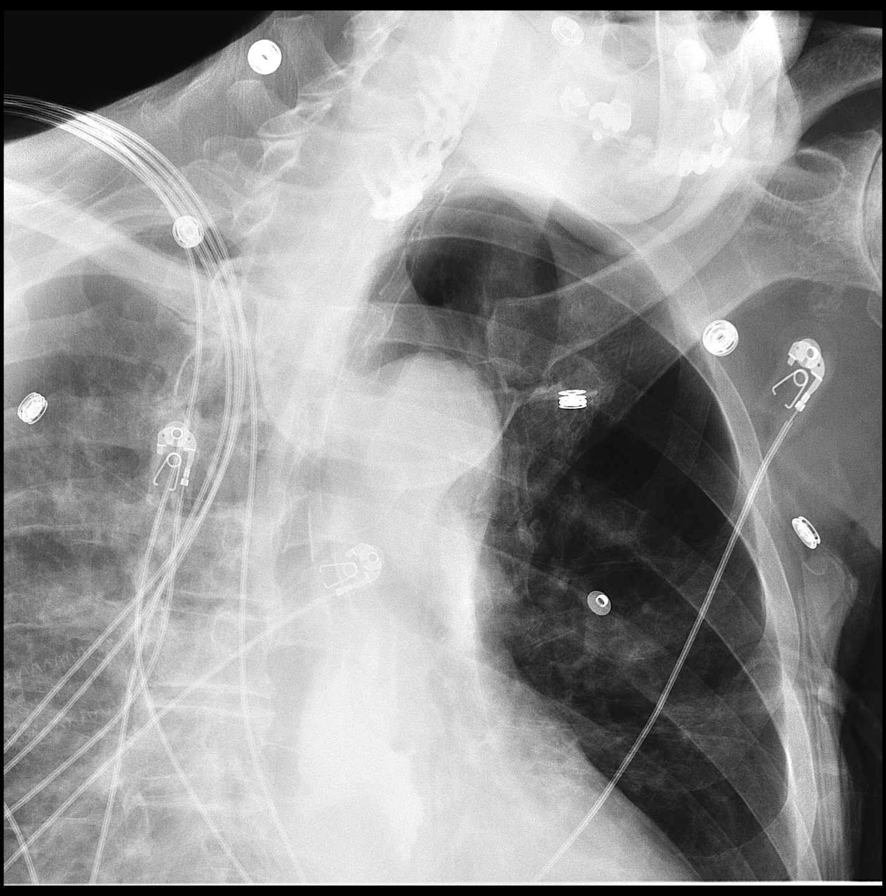

[Series 5: cp_standard · 0.51mm/px · 4 of 17 frames shown (3 of 3)]
[frame 1/17]
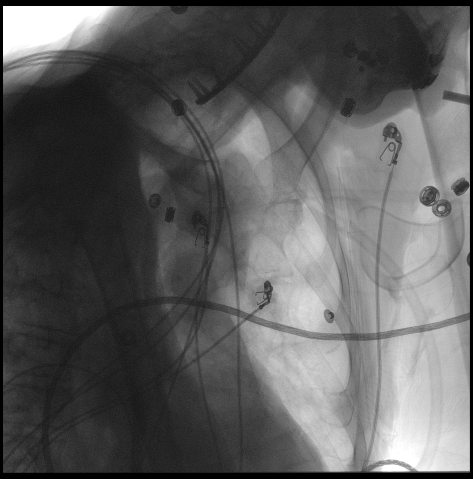
[frame 3/17]
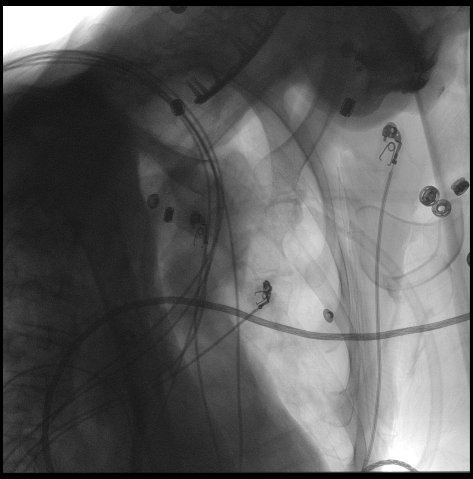
[frame 9/17]
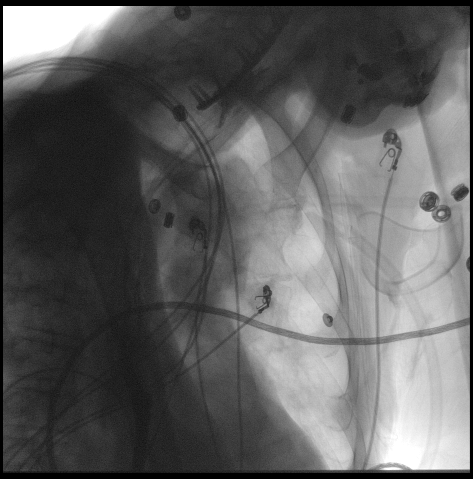
[frame 15/17]
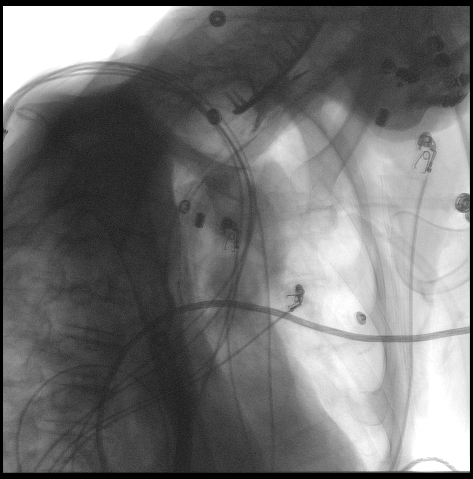

[14 of 14 positions shown; findings below may reference images not displayed]

FINDINGS: Study was very limited by lack of patient mobility. Patient was
imaged in a supine position, with the head of the bed elevated to
approximately 40 degrees, slightly LPO. Within the limitations of
today's examination, there was no definite communication between the
esophagus/stomach (patient has had esophagectomy with gastric
pull-through) and the overlying trachea. Throughout the examination,
extensive purulent secretions were spontaneously ejected and
suctioned from the patient's stoma, presumably from underlying left
lower lobe pneumonia (demonstrated on recent chest x-ray
03/20/2017).
IMPRESSION: 1. No definite tracheoesophageal fistula identified on today's
limited examination. However, as the patient could not be positioned
in a prone position (which would be ideal for determining potential
tracheoesophageal fistula), underlying tracheoesophageal fistula
cannot be entirely excluded on the basis of this examination.

## 2019-07-17 IMAGING — CR DG ABDOMEN 1V
2 series · 2 of 2 positions shown · non-contrast
Comparison: None.

CLINICAL DATA: Aspiration into airway. Pt is having possible
stomach contents leaking from fistula into lungs and out of his
stoma in his neck.

EXAM:
ABDOMEN - 1 VIEW

[abdomen kub (1 of 2)]
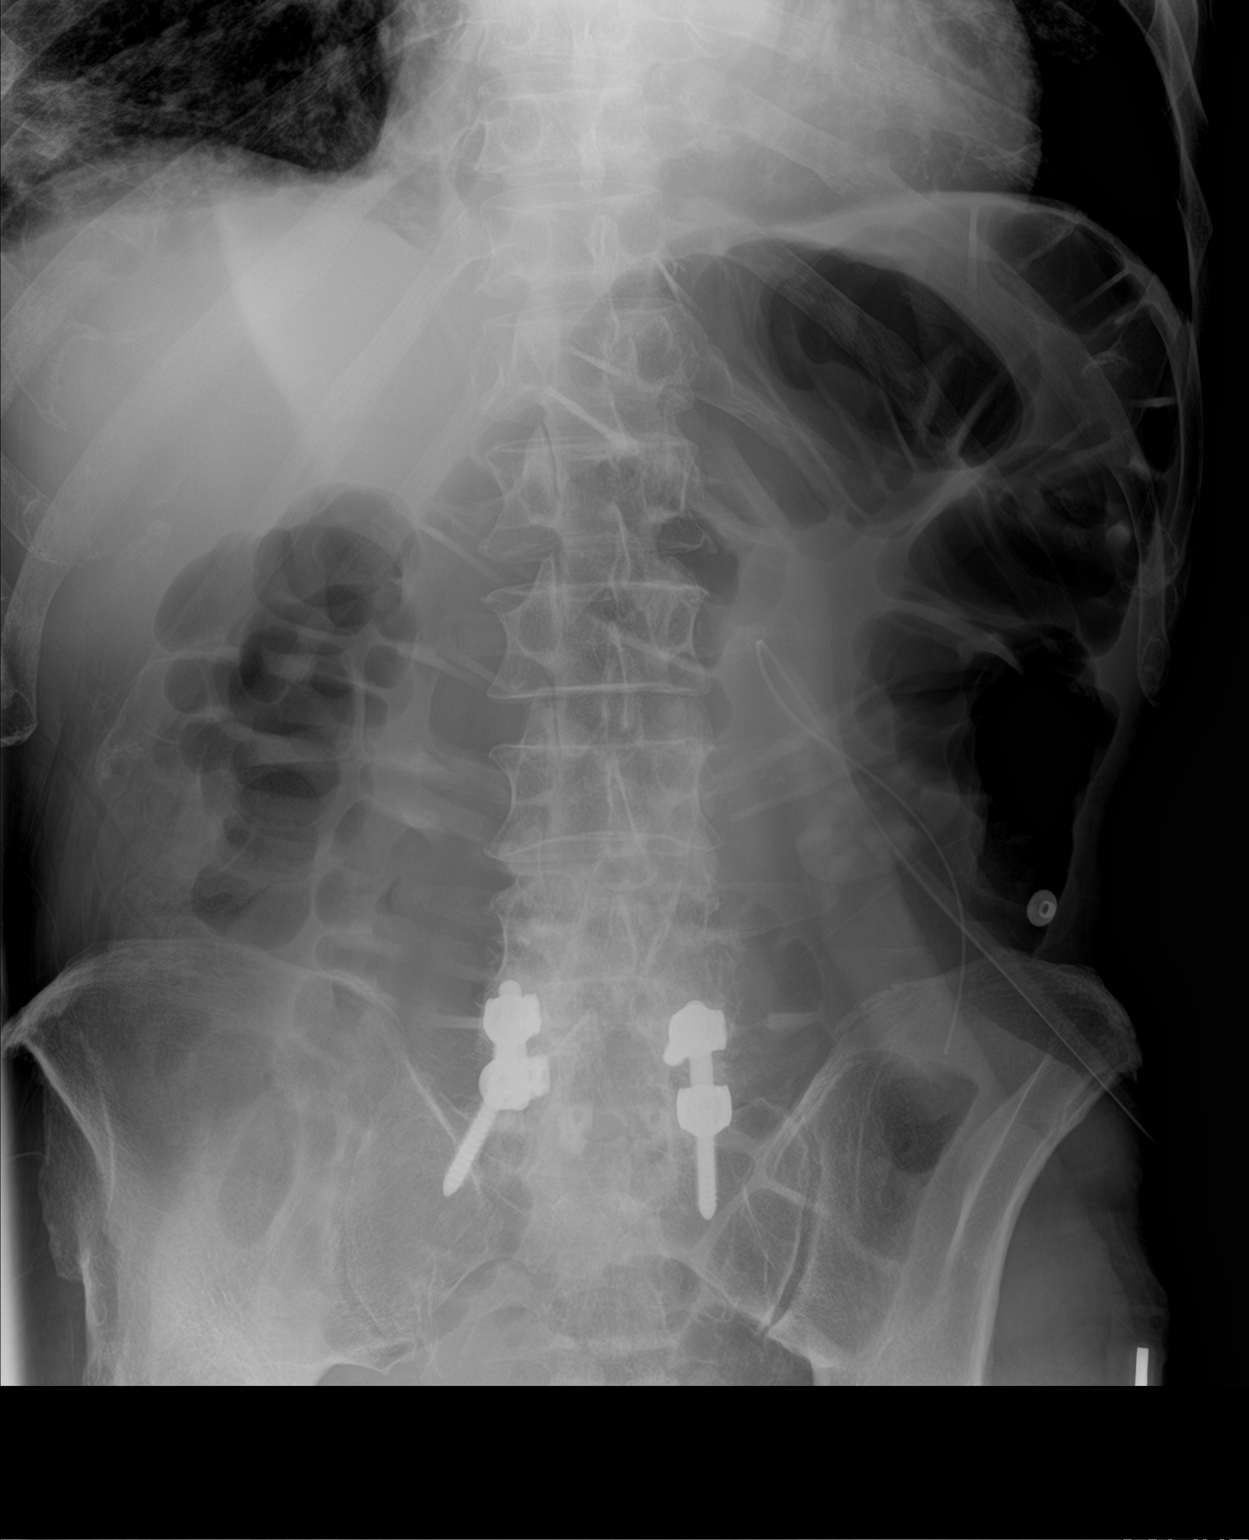

[abdomen kub (2 of 2)]
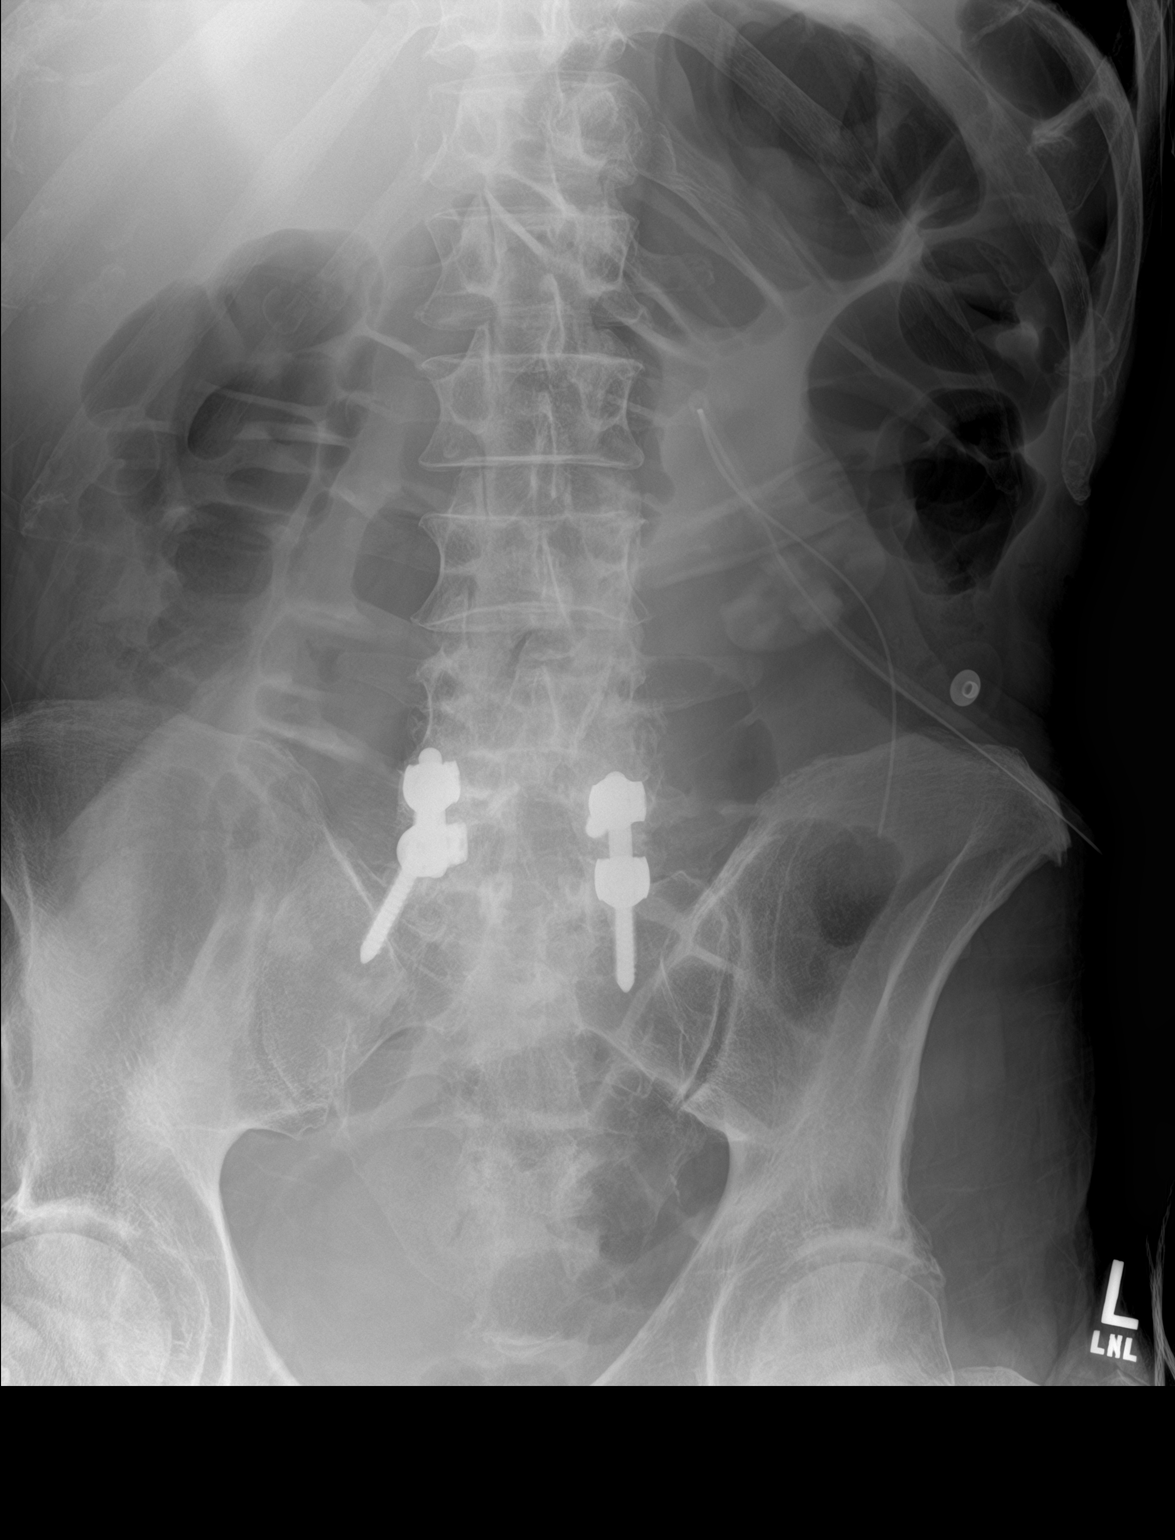

[2 of 2 positions shown; findings below may reference images not displayed]

FINDINGS: Large bowel appears mildly distended with air throughout. No dilated
small bowel loops appreciated. Drainage catheter overlies the left
abdomen.
IMPRESSION: Gaseous distention of colon, mild in degree, without corresponding
small bowel dilatation to suggest mechanical obstruction, presumed
colonic ileus.

Drainage catheter overlying the left lower quadrant.
